# Patient Record
Sex: Male | Born: 1995 | Race: Black or African American | Hispanic: No | Marital: Single | Smoking: Never smoker
Health system: Southern US, Community
[De-identification: ages and names within clinical notes are randomized; demographics above are authoritative.]

## PROBLEM LIST (undated history)

## (undated) DIAGNOSIS — E7800 Pure hypercholesterolemia, unspecified: Secondary | ICD-10-CM

## (undated) DIAGNOSIS — E119 Type 2 diabetes mellitus without complications: Secondary | ICD-10-CM

---

## 2020-03-15 ENCOUNTER — Encounter (HOSPITAL_COMMUNITY): Payer: Self-pay | Admitting: Emergency Medicine

## 2020-03-15 ENCOUNTER — Ambulatory Visit (INDEPENDENT_AMBULATORY_CARE_PROVIDER_SITE_OTHER)
Admission: EM | Admit: 2020-03-15 | Discharge: 2020-03-15 | Disposition: A | Discharge status: Home / Self Care | Payer: BC Managed Care – PPO | Source: Home / Self Care

## 2020-03-15 ENCOUNTER — Other Ambulatory Visit: Payer: Self-pay

## 2020-03-15 ENCOUNTER — Emergency Department (HOSPITAL_COMMUNITY)
Admission: EM | Admit: 2020-03-15 | Discharge: 2020-03-15 | Disposition: A | Discharge status: Home / Self Care | Payer: BC Managed Care – PPO | Attending: Emergency Medicine | Admitting: Emergency Medicine

## 2020-03-15 DIAGNOSIS — L0231 Cutaneous abscess of buttock: Secondary | ICD-10-CM

## 2020-03-15 DIAGNOSIS — Z5321 Procedure and treatment not carried out due to patient leaving prior to being seen by health care provider: Secondary | ICD-10-CM | POA: Insufficient documentation

## 2020-03-15 HISTORY — DX: Pure hypercholesterolemia, unspecified: E78.00

## 2020-03-15 HISTORY — DX: Type 2 diabetes mellitus without complications: E11.9

## 2020-03-15 LAB — CBC
HCT: 41.3 % (ref 39.0–52.0)
Hemoglobin: 14 g/dL (ref 13.0–17.0)
MCH: 28.5 pg (ref 26.0–34.0)
MCHC: 33.9 g/dL (ref 30.0–36.0)
MCV: 83.9 fL (ref 80.0–100.0)
Platelets: 244 10*3/uL (ref 150–400)
RBC: 4.92 MIL/uL (ref 4.22–5.81)
RDW: 12.3 % (ref 11.5–15.5)
WBC: 10.7 10*3/uL — ABNORMAL HIGH (ref 4.0–10.5)
nRBC: 0 % (ref 0.0–0.2)

## 2020-03-15 LAB — URINALYSIS, ROUTINE W REFLEX MICROSCOPIC
Bacteria, UA: NONE SEEN
Bilirubin Urine: NEGATIVE
Glucose, UA: >=500 mg/dL — AB
Ketones, ur: NEGATIVE mg/dL
Leukocytes,Ua: NEGATIVE
Nitrite: NEGATIVE
Protein, ur: NEGATIVE mg/dL
Specific Gravity, Urine: 1.027 (ref 1.005–1.030)
pH: 6 (ref 5.0–8.0)

## 2020-03-15 LAB — BASIC METABOLIC PANEL
Anion gap: 11 (ref 5–15)
BUN: 10 mg/dL (ref 6–20)
CO2: 24 mmol/L (ref 22–32)
Calcium: 8.8 mg/dL — ABNORMAL LOW (ref 8.9–10.3)
Chloride: 95 mmol/L — ABNORMAL LOW (ref 98–111)
Creatinine, Ser: 1.07 mg/dL (ref 0.61–1.24)
GFR calc Af Amer: >60 mL/min (ref >60)
GFR calc non Af Amer: >60 mL/min (ref >60)
Glucose, Bld: 470 mg/dL — ABNORMAL HIGH (ref 70–99)
Potassium: 4.8 mmol/L (ref 3.5–5.1)
Sodium: 130 mmol/L — ABNORMAL LOW (ref 135–145)

## 2020-03-15 LAB — CBG MONITORING, ED: Glucose-Capillary: 463 mg/dL — ABNORMAL HIGH (ref 70–99)

## 2020-03-15 MED ORDER — AMOXICILLIN-POT CLAVULANATE 875-125 MG PO TABS: 1.0000 | ORAL_TABLET | Freq: Two times a day (BID) | ORAL | 0 refills | Status: DC

## 2020-03-15 MED ORDER — MUPIROCIN 2 % EX OINT: 1.0000 "application " | TOPICAL_OINTMENT | Freq: Two times a day (BID) | CUTANEOUS | 0 refills | Status: DC

## 2020-03-15 NOTE — ED Triage Notes (Signed)
Pt reports abscess to buttocks X3 days. Hx Type 2 DM, states his sugars have been running "high" over past few days.

## 2020-03-15 NOTE — ED Triage Notes (Signed)
Pt c/o abscess to buttocks x3-4 days. States has drained a small amount and having severe pain.

## 2020-03-15 NOTE — ED Provider Notes (Signed)
[facility]    CSN: [id number] Arrival date & time: 03/15/20  8588      History   Chief Complaint Chief Complaint  Patient presents with  . Abscess    HPI [name] is a 24 y.o. male.   24 year old male with insulin dependant DM comes in for 3-4 day history of possible abscess. Has had pain and swelling to the left buttock, for which he feels self drained yesterday without much relief. Size has been increasing despite self drainage. Given location, unsure of erythema, warmth. Denies fever, chills, body aches. Has not tried anything for the symptoms.  DM uncontrolled, last a1c ~14, overdue for follow up.     Past Medical History:  Diagnosis Date  . Diabetes mellitus without complication ([place])   . High cholesterol     There are no problems to display for this patient.   History reviewed. No pertinent surgical history.     Home Medications    Prior to Admission medications   Medication Sig Start Date End Date Taking? Authorizing Provider  atorvastatin (LIPITOR) 80 MG tablet Take 80 mg by mouth daily.   Yes [provider]  glipiZIDE (GLUCOTROL) 10 MG tablet Take 5 mg by mouth daily before breakfast.   Yes [provider]  insulin glargine (LANTUS) 100 UNIT/ML injection Inject 35 Units into the skin daily.   Yes [provider]  amoxicillin-clavulanate (AUGMENTIN) 875-125 MG tablet Take 1 tablet by mouth every 12 (twelve) hours. 03/15/20   Tasia Catchings, Sharne Linders V, PA-C  mupirocin ointment (BACTROBAN) 2 % Apply 1 application topically 2 (two) times daily. 03/15/20   Ok Edwards, PA-C    Family History History reviewed. No pertinent family history.  Social History Social History   Tobacco Use  . Smoking status: Never Smoker  . Smokeless tobacco: Never Used  Substance Use Topics  . Alcohol use: Yes    Comment: Socially   . Drug use: Not Currently     Allergies   Patient has no known allergies.   Review of Systems Review  of Systems  Reason unable to perform ROS: See HPI as above.     Physical Exam Triage Vital Signs ED Triage Vitals  Enc Vitals Group     BP 03/15/20 0828 117/71     Pulse Rate 03/15/20 0828 (!) 106     Resp 03/15/20 0828 16     Temp 03/15/20 0828 100.3 F (37.9 C)     Temp Source 03/15/20 0828 Oral     SpO2 03/15/20 0828 94 %     Weight --      Height --      Head Circumference --      Peak Flow --      Pain Score 03/15/20 0829 9     Pain Loc --      Pain Edu? --      Excl. in [place]? --    No data found.  Updated Vital Signs BP 117/71 (BP Location: Left Arm)   Pulse (!) 106   Temp 100.3 F (37.9 C) (Oral)   Resp 16   SpO2 94%   Visual Acuity Right Eye Distance:   Left Eye Distance:   Bilateral Distance:    Right Eye Near:   Left Eye Near:    Bilateral Near:     Physical Exam Exam conducted with a chaperone present.  Constitutional:      General: He is not in acute distress.  Appearance: Normal appearance. He is well-developed. He is not toxic-appearing or diaphoretic.  HENT:     Head: Normocephalic and atraumatic.  Eyes:     Conjunctiva/sclera: Conjunctivae normal.     Pupils: Pupils are equal, round, and reactive to light.  Pulmonary:     Effort: Pulmonary effort is normal. No respiratory distress.     Comments: Speaking in full sentences without difficulty Genitourinary:    Comments: Left buttock abscess adjacent to the intergluteal cleft. 5cm away from rectum and does not extend to the rectum. No tenderness to the rectum. No rectal wall pain.  Abscess measures 1cm x 2cm with surrounding induration. No erythema, warmth.  Musculoskeletal:     Cervical back: Normal range of motion and neck supple.  Skin:    General: Skin is warm and dry.  Neurological:     Mental Status: He is alert and oriented to person, place, and time.      UC Treatments / Results  Labs (all labs ordered are listed, but only abnormal results are displayed) Labs Reviewed    AEROBIC CULTURE (SUPERFICIAL SPECIMEN)    EKG   Radiology No results found.  Procedures Incision and Drainage  Date/Time: 03/15/2020 9:26 AM Performed by: Belinda Fisher, PA-C Authorized by: Belinda Fisher, PA-C   Consent:    Consent obtained:  Verbal   Consent given by:  Patient   Risks discussed:  Bleeding, incomplete drainage, pain, infection and damage to other organs   Alternatives discussed:  Alternative treatment and referral Location:    Type:  Abscess   Size:  1cm x 2cm   Location:  Lower extremity   Lower extremity location:  Buttock   Buttock location:  L buttock Pre-procedure details:    Skin preparation:  Chloraprep Anesthesia (see MAR for exact dosages):    Anesthesia method:  Local infiltration   Local anesthetic:  Lidocaine 2% WITH epi Procedure type:    Complexity:  Simple Procedure details:    Needle aspiration: no     Incision types:  Single straight   Incision depth:  Subcutaneous   Scalpel blade:  11   Wound management:  Probed and deloculated, irrigated with saline and extensive cleaning   Drainage:  Purulent and bloody   Drainage amount:  Moderate   Wound treatment:  Wound left open   Packing materials:  None Post-procedure details:    Patient tolerance of procedure:  Tolerated well, no immediate complications   (including critical care time)  Medications Ordered in UC Medications - No data to display  Initial Impression / Assessment and Plan / UC Course  I have reviewed the triage vital signs and the nursing notes.  Pertinent labs & imaging results that were available during my care of the patient were reviewed by me and considered in my medical decision making (see chart for details).    Patient tolerated procedure well. Given uncontrolled DM, wound culture obtained. Patient to start augmentin. Wound care instructions given. Return precautions given. Patient expresses understanding and agrees to plan.   Final Clinical Impressions(s) / UC  Diagnoses   Final diagnoses:  Left buttock abscess   ED Prescriptions    Medication Sig Dispense Auth. Provider   amoxicillin-clavulanate (AUGMENTIN) 875-125 MG tablet Take 1 tablet by mouth every 12 (twelve) hours. 14 tablet Floraine Buechler V, PA-C   mupirocin ointment (BACTROBAN) 2 % Apply 1 application topically 2 (two) times daily. 22 g Belinda Fisher, PA-C     PDMP not reviewed  this encounter.   Belinda Fisher, PA-C 03/15/20 [phone]

## 2020-03-15 NOTE — Discharge Instructions (Addendum)
Start augmentin as directed. You can remove current dressing in 24 hours. Keep wound clean and dry. You can clean gently with soap and water. Warm compresses. Monitor for spreading redness, increased warmth, increased swelling, fever, follow up for reevaluation needed.

## 2020-03-20 LAB — AEROBIC CULTURE W GRAM STAIN (SUPERFICIAL SPECIMEN)

## 2020-09-18 ENCOUNTER — Other Ambulatory Visit: Payer: BC Managed Care – PPO

## 2020-10-25 ENCOUNTER — Other Ambulatory Visit: Payer: Self-pay

## 2020-10-25 ENCOUNTER — Ambulatory Visit (HOSPITAL_COMMUNITY)
Admission: EM | Admit: 2020-10-25 | Discharge: 2020-10-25 | Disposition: A | Discharge status: Home / Self Care | Payer: Self-pay | Attending: Family Medicine | Admitting: Family Medicine

## 2020-10-25 ENCOUNTER — Encounter (HOSPITAL_COMMUNITY): Payer: Self-pay

## 2020-10-25 DIAGNOSIS — R0789 Other chest pain: Secondary | ICD-10-CM

## 2020-10-25 MED ORDER — NAPROXEN 500 MG PO TABS: 500.0000 mg | ORAL_TABLET | Freq: Two times a day (BID) | ORAL | 0 refills | Status: AC

## 2020-10-25 NOTE — ED Triage Notes (Signed)
Pt in with c/o sharp right side chest pain that started this morning while at work. States he went to sleep and woke up an pain was worse.  Has not had any medication for pain  Denies any n/v, radiation to jaw, left chest, arm, back pain, sob, or dizziness

## 2020-10-25 NOTE — ED Notes (Addendum)
EKG ordered and completed. Shown to H. Sharyon Cable, NP

## 2020-10-25 NOTE — ED Provider Notes (Signed)
[facility]    CSN: [id number] Arrival date & time: 10/25/20  1329      History   Chief Complaint Chief Complaint  Patient presents with   Chest Pain    HPI [name] is a 24 y.o. male history of DM type II, hyperlipidemia, presenting today for evaluation of right-sided chest pain.  Patient reports that he began to develop some discomfort in his right chest at the end of his shift last night.  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  Denies injury or trauma.  He went to sleep, woke up with increased pain on the right side.  Pain is constant, but worsens with certain movements of his chest, become sharp in nature with certain movements.  Denies shortness of breath.  Denies radiation to left side abdomen or to back.  Denies any nausea or vomiting.  Denies headache or vision changes.  Denies dizziness or lightheadedness.  Reports occasional cigar smoking, denies cigarettes or vaping.  Denies recreational drug use including cocaine.  Denies prior DVT/PE.  Denies leg pain or unilateral leg swelling.  Has recently traveled to [place] with a 9-hour car ride.  Denies family history of early death from heart related issues.  HPI  Past Medical History:  Diagnosis Date   Diabetes mellitus without complication (HCC)    High cholesterol     There are no problems to display for this patient.   History reviewed. No pertinent surgical history.     Home Medications    Prior to Admission medications   Medication Sig Start Date End Date Taking? Authorizing Provider  atorvastatin (LIPITOR) 80 MG tablet Take 80 mg by mouth daily.   Yes [provider]  glipiZIDE (GLUCOTROL) 10 MG tablet Take 5 mg by mouth daily before breakfast.   Yes [provider]  insulin glargine (LANTUS) 100 UNIT/ML injection Inject 35 Units into the skin daily.   Yes [provider]  mupirocin ointment (BACTROBAN) 2 % Apply 1 application topically 2  (two) times daily. 03/15/20   Cathie Hoops, Amy V, PA-C  naproxen (NAPROSYN) 500 MG tablet Take 1 tablet (500 mg total) by mouth 2 (two) times daily for 7 days. 10/25/20 11/01/20  Gregery Walberg C, PA-C  RYBELSUS 3 MG TABS Take 1 tablet by mouth at bedtime. 06/21/20   [provider]    Family History History reviewed. No pertinent family history.  Social History Social History   Tobacco Use   Smoking status: Never Smoker   Smokeless tobacco: Never Used  Substance Use Topics   Alcohol use: Yes    Comment: Socially    Drug use: Not Currently     Allergies   Patient has no known allergies.   Review of Systems Review of Systems  Constitutional: Negative for activity change, appetite change, chills, fatigue and fever.  HENT: Negative for congestion, ear pain, rhinorrhea, sinus pressure, sore throat and trouble swallowing.   Eyes: Negative for discharge and redness.  Respiratory: Negative for cough, chest tightness and shortness of breath.   Cardiovascular: Positive for chest pain.  Gastrointestinal: Negative for abdominal pain, diarrhea, nausea and vomiting.  Musculoskeletal: Negative for myalgias.  Skin: Negative for rash.  Neurological: Negative for dizziness, light-headedness and headaches.     Physical Exam Triage Vital Signs ED Triage Vitals  Enc Vitals Group     BP 10/25/20 1451 (!) 154/86     Pulse Rate 10/25/20 1451 92     Resp  10/25/20 1451 19     Temp --      Temp Source 10/25/20 1451 Oral     SpO2 10/25/20 1451 100 %     Weight --      Height --      Head Circumference --      Peak Flow --      Pain Score 10/25/20 1449 8     Pain Loc --      Pain Edu? --      Excl. in [place]? --    No data found.  Updated Vital Signs BP (!) 154/86 (BP Location: Right Arm)    Pulse 92    Resp 19    SpO2 100%   Visual Acuity Right Eye Distance:   Left Eye Distance:   Bilateral Distance:    Right Eye Near:   Left Eye Near:    Bilateral Near:     Physical  Exam Vitals and nursing note reviewed.  Constitutional:      Appearance: He is well-developed.     Comments: No acute distress  HENT:     Head: Normocephalic and atraumatic.     Nose: Nose normal.     Mouth/Throat:     Comments: Oral mucosa pink and moist, no tonsillar enlargement or exudate. Posterior pharynx patent and nonerythematous, no uvula deviation or swelling. Normal phonation. Eyes:     Extraocular Movements: Extraocular movements intact.     Conjunctiva/sclera: Conjunctivae normal.     Pupils: Pupils are equal, round, and reactive to light.  Cardiovascular:     Rate and Rhythm: Normal rate.  Pulmonary:     Effort: Pulmonary effort is normal. No respiratory distress.     Comments: Breathing comfortably at rest, CTABL, no wheezing, rales or other adventitious sounds auscultated  Reproducible tenderness to palpation of right anterior chest Abdominal:     General: There is no distension.  Musculoskeletal:        General: Normal range of motion.     Cervical back: Neck supple.     Comments: Trace edema from socks, no unilateral swelling, no calf tenderness, negative Homans bilaterally  Skin:    General: Skin is warm and dry.  Neurological:     Mental Status: He is alert and oriented to person, place, and time.      UC Treatments / Results  Labs (all labs ordered are listed, but only abnormal results are displayed) Labs Reviewed - No data to display  EKG   Radiology No results found.  Procedures Procedures (including critical care time)  Medications Ordered in UC Medications - No data to display  Initial Impression / Assessment and Plan / UC Course  I have reviewed the triage vital signs and the nursing notes.  Pertinent labs & imaging results that were available during my care of the patient were reviewed by me and considered in my medical decision making (see chart for details).  Clinical Course as of Oct 26 1527  Thu Oct 25, 2020  1502 EKG normal  sinus rhythm, nonspecific T wave inversions noted in lead III, no acute signs of ischemia or infarction   [HW]    Clinical Course User Index [HW] Nello Corro C, PA-C    EKG reassuring, reproducible tenderness to palpation, worse with certain movements of chest, suspect most likely chest wall pain/inflammation and recommending anti-inflammatories.  Advised to continue to monitor, if moving towards left, worsening or pain exertional to follow-up in emergency room.  Follow-up with PCP.  Discussed strict return precautions. Patient verbalized understanding and is agreeable with plan.  Final Clinical Impressions(s) / UC Diagnoses   Final diagnoses:  Chest wall pain  Atypical chest pain     Discharge Instructions     EKG normal Naprosyn twice daily with food Monitor for gradual improvement of pain Follow up in the emergency room if pain worsening, developing left sided pain, shortness of breath    ED Prescriptions    Medication Sig Dispense Auth. Provider   naproxen (NAPROSYN) 500 MG tablet Take 1 tablet (500 mg total) by mouth 2 (two) times daily for 7 days. 30 tablet Vonceil [place], [place] C, PA-C     PDMP not reviewed this encounter.   Lew Dawes, PA-C 10/25/20 1528

## 2020-10-25 NOTE — Discharge Instructions (Addendum)
EKG normal Naprosyn twice daily with food Monitor for gradual improvement of pain Follow up in the emergency room if pain worsening, developing left sided pain, shortness of breath

## 2021-01-02 ENCOUNTER — Encounter (HOSPITAL_COMMUNITY): Payer: Self-pay

## 2021-01-02 ENCOUNTER — Other Ambulatory Visit: Payer: Self-pay

## 2021-01-02 ENCOUNTER — Ambulatory Visit (HOSPITAL_COMMUNITY)
Admission: EM | Admit: 2021-01-02 | Discharge: 2021-01-02 | Disposition: A | Discharge status: Home / Self Care | Payer: 59 | Attending: Urgent Care | Admitting: Urgent Care

## 2021-01-02 DIAGNOSIS — R1010 Upper abdominal pain, unspecified: Secondary | ICD-10-CM | POA: Diagnosis not present

## 2021-01-02 DIAGNOSIS — E1165 Type 2 diabetes mellitus with hyperglycemia: Secondary | ICD-10-CM

## 2021-01-02 LAB — CBG MONITORING, ED: Glucose-Capillary: 339 mg/dL — ABNORMAL HIGH (ref 70–99)

## 2021-01-02 MED ORDER — FAMOTIDINE 20 MG PO TABS: 20.0000 mg | ORAL_TABLET | Freq: Two times a day (BID) | ORAL | 0 refills | Status: AC

## 2021-01-02 NOTE — ED Triage Notes (Signed)
Pt presents with stomach pain x 1 day. Pt states he is unsure if it is caused by drinking a lot of soda and caffeine in  The past 24 hours. Pt states he is having a headache x 1 day. He states he did not take OTC to relieve pain. Pt denies nausea and vomiting.

## 2021-01-02 NOTE — ED Provider Notes (Signed)
[facility]   MRN: [id number] DOB: 1996/07/11  Subjective:   [name] is a 25 y.o. male presenting for 1 day history of acute onset upper abdominal pain.  Patient is an uncontrolled diabetic.  He recently started taking his insulin again.  Admits that he is not practicing any kind of healthy diet.  Denies fever, headache, confusion, chest pain, shortness of breath, vomiting, diarrhea, bloody stools.  He is drinking a lot of soda and caffeine beverages.  No current facility-administered medications for this encounter.  Current Outpatient Medications:  .  glipiZIDE (GLUCOTROL) 10 MG tablet, Take 5 mg by mouth daily before breakfast., Disp: , Rfl:  .  atorvastatin (LIPITOR) 80 MG tablet, Take 80 mg by mouth daily., Disp: , Rfl:  .  insulin glargine (LANTUS) 100 UNIT/ML injection, Inject 35 Units into the skin daily., Disp: , Rfl:  .  mupirocin ointment (BACTROBAN) 2 %, Apply 1 application topically 2 (two) times daily., Disp: 22 g, Rfl: 0 .  RYBELSUS 3 MG TABS, Take 1 tablet by mouth at bedtime., Disp: , Rfl:    No Known Allergies  Past Medical History:  Diagnosis Date  . Diabetes mellitus without complication (H[facility])   . High cholesterol      History reviewed. No pertinent surgical history.  History reviewed. No pertinent family history.  Social History   Tobacco Use  . Smoking status: Never Smoker  . Smokeless tobacco: Never Used  Substance Use Topics  . Alcohol use: Yes    Comment: Socially   . Drug use: Not Currently    ROS   Objective:   Vitals: BP 135/86 (BP Location: Left Arm)   Pulse 98   Temp 98.9 F (37.2 C) (Oral)   Resp 17   SpO2 98%   Physical Exam Constitutional:      General: He is not in acute distress.    Appearance: Normal appearance. He is well-developed and well-nourished. He is not ill-appearing, toxic-appearing or diaphoretic.  HENT:     Head: Normocephalic and atraumatic.     Right Ear: External ear normal.      Left Ear: External ear normal.     Nose: Nose normal.     Mouth/Throat:     Mouth: Oropharynx is clear and moist. Mucous membranes are moist.     Pharynx: Oropharynx is clear.  Eyes:     General: No scleral icterus.    Extraocular Movements: Extraocular movements intact.     Pupils: Pupils are equal, round, and reactive to light.  Cardiovascular:     Rate and Rhythm: Normal rate and regular rhythm.     Pulses: Intact distal pulses.     Heart sounds: Normal heart sounds. No murmur heard. No friction rub. No gallop.   Pulmonary:     Effort: Pulmonary effort is normal. No respiratory distress.     Breath sounds: Normal breath sounds. No stridor. No wheezing, rhonchi or rales.  Abdominal:     General: Bowel sounds are normal. There is no distension.     Palpations: Abdomen is soft. There is no mass.     Tenderness: There is no abdominal tenderness. There is no guarding or rebound.  Skin:    General: Skin is warm and dry.  Neurological:     Mental Status: He is alert and oriented to person, place, and time.  Psychiatric:        Mood and Affect: Mood and affect and mood normal.  Behavior: Behavior normal.        Thought Content: Thought content normal.     Results for orders placed or performed during the hospital encounter of 01/02/21 (from the past 24 hour(s))  POC CBG monitoring     Status: Abnormal   Collection Time: 01/02/21  4:00 PM  Result Value Ref Range   Glucose-Capillary 339 (H) 70 - 99 mg/dL    Assessment and Plan :   PDMP not reviewed this encounter.  1. Uncontrolled type 2 diabetes mellitus with hyperglycemia (HCC)   2. Upper abdominal pain     Offered Pepcid to address possible acid reflux but have high suspicion that patient's poor diet choices are affecting his upper abdominal pain.  He had none on exam.  Recommended strict diabetic diet.  Follow-up with primary care provider for better diabetic control. Counseled patient on potential for adverse  effects with medications prescribed/recommended today, ER and return-to-clinic precautions discussed, patient verbalized understanding.    Wallis Bamberg, PA-C 01/02/21 1635

## 2021-01-02 NOTE — Discharge Instructions (Addendum)

## 2021-04-04 NOTE — Progress Notes (Addendum)
[facility] - Clinic Note  04/08/2021     CHIEF COMPLAINT Patient presents for Retina Evaluation   HISTORY OF PRESENT ILLNESS: [name] is a 25 y.o. male who presents to the clinic today for:   HPI    Retina Evaluation    In both eyes.  This started 4 months ago.  Duration of 7 days.  Associated Symptoms Distortion.  Treatments tried include no treatments.  I, the attending physician,  performed the HPI with the patient and updated documentation appropriately.          Comments    Pt referred by Dr. Illene Manning for CME OU, OS worse than OD. Pt states that in January 2022 is when he first noticed distortion in vision OS. States that he will look at straight lines and see jagged marks. No reports of floaters or flashes. Does report some soreness when he closes his eyes, mostly just OS.        Last edited by Greg Chris, MD on 04/08/2021  4:17 PM. (History)    pt is here on the referral of Dr. Illene Manning for concern of CME OS, pt states he went to see her for a routine eye exam, but is having a problem with his left eye, he states to the right of his center vision, he sees a yellow spot that distorts his vision, he started to notices this 2-3 months ago and has stayed the same since then, pt is diabetic (dx 2009) and his last A1c was 12.1 in September 2021, pt states he has been under a lot of stress since February  Referring physician: Illene Manning, OD [address] [place],  [place] [address]  HISTORICAL INFORMATION:   Selected notes from the MEDICAL RECORD NUMBER Referred by Dr. Illene Manning for eval of CME OU LEE:  Ocular Hx- PMH-    CURRENT MEDICATIONS: No current outpatient medications on file. (Ophthalmic Drugs)   No current facility-administered medications for this visit. (Ophthalmic Drugs)   Current Outpatient Medications (Other)  Medication Sig  . atorvastatin (LIPITOR) 80 MG tablet Take 80 mg by mouth daily.  .  famotidine (PEPCID) 20 MG tablet Take 1 tablet (20 mg total) by mouth 2 (two) times daily.  Marland Kitchen glipiZIDE (GLUCOTROL) 10 MG tablet Take 5 mg by mouth daily before breakfast.  . insulin glargine (LANTUS) 100 UNIT/ML injection Inject 35 Units into the skin daily.  . mupirocin ointment (BACTROBAN) 2 % Apply 1 application topically 2 (two) times daily.  . RYBELSUS 3 MG TABS Take 1 tablet by mouth at bedtime.   No current facility-administered medications for this visit. (Other)      REVIEW OF SYSTEMS: ROS    Positive for: Endocrine   Negative for: Constitutional, Gastrointestinal, Neurological, Skin, Genitourinary, Musculoskeletal, HENT, Cardiovascular, Eyes, Respiratory, Psychiatric, Allergic/Imm, Heme/Lymph   Last edited by Celine Mans, COA on 04/08/2021  1:16 PM. (History)       ALLERGIES No Known Allergies  PAST MEDICAL HISTORY Past Medical History:  Diagnosis Date  . Diabetes mellitus without complication (HCC)   . High cholesterol    History reviewed. No pertinent surgical history.  FAMILY HISTORY Family History  Problem Relation Age of Onset  . Diabetes Mother   . Hypertension Mother     SOCIAL HISTORY Social History   Tobacco Use  . Smoking status: Never Smoker  . Smokeless tobacco: Never Used  Substance Use Topics  . Alcohol use: Yes  Comment: Socially   . Drug use: Not Currently         OPHTHALMIC EXAM:  Base Eye Exam    Visual Acuity (Snellen - Linear)      Right Left   Dist [place] 20/40 -2 20/30 -1   Dist ph [place] 20/30 +1 20/25       Tonometry (Tonopen, 1:16 PM)      Right Left   Pressure 23 24       Pupils      Dark Light Shape React APD   Right 3 2 Round Brisk None   Left 3 2 Round Brisk None       Visual Fields (Counting fingers)      Left Right    Full Full       Extraocular Movement      Right Left    Full, Ortho Full, Ortho       Neuro/Psych    Oriented x3: Yes   Mood/Affect: Normal       Dilation    Both eyes: 1.0%  Mydriacyl, 2.5% Phenylephrine @ 1:17 PM        Slit Lamp and Fundus Exam    Slit Lamp Exam      Right Left   Lids/Lashes Normal mild MGD   Conjunctiva/Sclera White and quiet White and quiet   Cornea Clear Clear   Anterior Chamber Deep and quiet Deep and quiet   Iris Round and dilated, No NVI Round and dilated, No NVI   Lens Clear Clear   Vitreous Mild Vitreous syneresis Mild Vitreous syneresis       Fundus Exam      Right Left   Disc Pink and Sharp Pink and Sharp   C/D Ratio 0.5 0.5   Macula Flat, Good foveal reflex, rare MA, No edema Blunted foveal reflex, focal IRH and exudates temporal fovea, rare MA   Vessels mild tortuousity mild tortuousity   Periphery Attached, rare MA   Attached, rare MA           Refraction    Wearing Rx   Pt did receive rx from Dr. Katrinka Blazing on 04/02/21 but is waiting on specs. Does not currently wear them.        Manifest Refraction      Sphere Cylinder Dist VA   Right -0.50 Sphere 20/25-2   Left -0.50 Sphere 20/30+2          IMAGING AND PROCEDURES  Imaging and Procedures for 04/08/2021  OCT, Retina - OU - Both Eyes       Right Eye Quality was good. Central Foveal Thickness: 247. Progression has no prior data. Findings include normal foveal contour, no IRF, no SRF.   Left Eye Quality was good. Central Foveal Thickness: 293. Progression has no prior data. Findings include abnormal foveal contour, intraretinal fluid, intraretinal hyper-reflective material, subretinal fluid (Central IRF/SRF/IRHM).   Notes *Images captured and stored on drive  Diagnosis / Impression:  OD: NFP, no IRF/SRF OS: Central IRF/SRF/IRHM  Clinical management:  See below  Abbreviations: NFP - Normal foveal profile. CME - cystoid macular edema. PED - pigment epithelial detachment. IRF - intraretinal fluid. SRF - subretinal fluid. EZ - ellipsoid zone. ERM - epiretinal membrane. ORA - outer retinal atrophy. ORT - outer retinal tubulation. SRHM - subretinal  hyper-reflective material. IRHM - intraretinal hyper-reflective material               ASSESSMENT/PLAN:    ICD-10-CM   1. Retinal edema  H35.81 OCT, Retina - OU - Both Eyes  2. 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 Retinal Edema OS  - pt reports 2 mo history of nasal paracentral distortion -- started in Feb and has been relatively stable  - exam shows focal intraretinal heme w/ surrounding exudates, temporal fovea  - OCT shows focal cluster of edema w/ IRF, SRF, IRHM, temporal fovea  - differential includes DME, CSR and hypertensive retinopathy / retinal macroaneurysm  - BP in office today, 130s/80s  - pt endorses being under a lot of stress since February (facing eviction and repossession)  - needs fluorescein angiogram to discern -- unable to perform today -- will check in 3 wks  - f/u 3 weeks, DFE, OCT, FA (transit OS)  2. Mild-moderate nonproliferative diabetic retinopathy OU  - OD w/o DME  - OS w/ DME  - A1c 12.1% in September 2021 - The incidence, risk factors for progression, natural history and treatment options for diabetic retinopathy were discussed with patient.   - The need for close monitoring of blood glucose, blood pressure, and serum lipids, avoiding cigarette or any type of tobacco, and the need for long term follow up was also discussed with patient. - exam shows rare MA - OCT without diabetic macular edema OD, OS w/ focal edema, temporal fovea as above  - f/u 3 weeks, DFE, OCT, FA (transit OS)   Ophthalmic Meds Ordered this visit:  No orders of the defined types were placed in this encounter.      Return in about 3 weeks (around 04/29/2021) for f/u retinal edema OS, DFE, OCT, FA (transit OS).  There are no Patient Instructions on file for this visit.  This document serves as a record of services personally performed by Karie ChimeraBrian G. Vitor Overbaugh, MD, PhD. It  was created on their behalf by Herby AbrahamAshley English, COA, an ophthalmic technician. The creation of this record is the provider's dictation and/or activities during the visit.    Electronically signed by: Herby AbrahamAshley English, COA @TODAY @ 4:25 PM  Karie ChimeraBrian G. Benigna Delisi, M.D., Ph.D. Diseases & Surgery of the Retina and Vitreous [facility] 04/08/2021   I have reviewed the above documentation for accuracy and completeness, and I agree with the above. Karie ChimeraBrian G. Falicity Sheets, M.D., Ph.D. 04/08/21 4:25 PM   Abbreviations: M myopia (nearsighted); A astigmatism; H hyperopia (farsighted); P presbyopia; Mrx spectacle prescription;  CTL contact lenses; OD right eye; OS left eye; OU both eyes  XT exotropia; ET esotropia; PEK punctate epithelial keratitis; PEE punctate epithelial erosions; DES dry eye syndrome; MGD meibomian gland dysfunction; ATs artificial tears; PFAT's preservative free artificial tears; NSC nuclear sclerotic cataract; PSC posterior subcapsular cataract; ERM epi-retinal membrane; PVD posterior vitreous detachment; RD retinal detachment; DM diabetes mellitus; DR diabetic retinopathy; NPDR non-proliferative diabetic retinopathy; PDR proliferative diabetic retinopathy; CSME clinically significant macular edema; DME diabetic macular edema; dbh dot blot hemorrhages; CWS cotton wool spot; POAG primary open angle glaucoma; C/D cup-to-disc ratio; HVF humphrey visual field; GVF goldmann visual field; OCT optical coherence tomography; IOP intraocular pressure; BRVO Branch retinal vein occlusion; CRVO central retinal vein occlusion; CRAO central retinal artery occlusion; BRAO branch retinal artery occlusion; RT retinal tear; SB scleral buckle; PPV pars plana vitrectomy; VH Vitreous hemorrhage; PRP panretinal laser photocoagulation; IVK intravitreal kenalog; VMT vitreomacular traction; MH Macular hole;  NVD neovascularization of the disc; NVE neovascularization elsewhere; AREDS age related eye  disease study;  ARMD age related macular degeneration; POAG primary open angle glaucoma; EBMD epithelial/anterior basement membrane dystrophy; ACIOL anterior chamber intraocular lens; IOL intraocular lens; PCIOL posterior chamber intraocular lens; Phaco/IOL phacoemulsification with intraocular lens placement; PRK photorefractive keratectomy; LASIK laser assisted in situ keratomileusis; HTN hypertension; DM diabetes mellitus; COPD chronic obstructive pulmonary disease

## 2021-04-08 ENCOUNTER — Ambulatory Visit (INDEPENDENT_AMBULATORY_CARE_PROVIDER_SITE_OTHER): Payer: 59 | Admitting: Ophthalmology

## 2021-04-08 ENCOUNTER — Other Ambulatory Visit: Payer: Self-pay

## 2021-04-08 ENCOUNTER — Encounter (INDEPENDENT_AMBULATORY_CARE_PROVIDER_SITE_OTHER): Payer: Self-pay | Admitting: Ophthalmology

## 2021-04-08 VITALS — BP 130/88

## 2021-04-08 DIAGNOSIS — H3581 Retinal edema: Secondary | ICD-10-CM | POA: Diagnosis not present

## 2021-04-08 DIAGNOSIS — E113311 Type 2 diabetes mellitus with moderate nonproliferative diabetic retinopathy with macular edema, right eye: Secondary | ICD-10-CM | POA: Diagnosis not present

## 2021-04-26 NOTE — Progress Notes (Shared)
[facility] - Clinic Note  04/29/2021     CHIEF COMPLAINT Patient presents for No chief complaint on file.   HISTORY OF PRESENT ILLNESS: [name] is a 25 y.o. male who presents to the clinic today for:   pt is here on the referral of Dr. Illene Labrador for concern of CME OS, pt states he went to see her for a routine eye exam, but is having a problem with his left eye, he states to the right of his center vision, he sees a yellow spot that distorts his vision, he started to notices this 2-3 months ago and has stayed the same since then, pt is diabetic (dx 2009) and his last A1c was 12.1 in September 2021, pt states he has been under a lot of stress since February  Referring physician: Caralee Ates, MD [address] [place],  [place] [address]  HISTORICAL INFORMATION:   Selected notes from the MEDICAL RECORD NUMBER Referred by Dr. Illene Labrador for eval of CME OU LEE:  Ocular Hx- PMH-    CURRENT MEDICATIONS: No current outpatient medications on file. (Ophthalmic Drugs)   No current facility-administered medications for this visit. (Ophthalmic Drugs)   Current Outpatient Medications (Other)  Medication Sig  . atorvastatin (LIPITOR) 80 MG tablet Take 80 mg by mouth daily.  . famotidine (PEPCID) 20 MG tablet Take 1 tablet (20 mg total) by mouth 2 (two) times daily.  Marland Kitchen glipiZIDE (GLUCOTROL) 10 MG tablet Take 5 mg by mouth daily before breakfast.  . insulin glargine (LANTUS) 100 UNIT/ML injection Inject 35 Units into the skin daily.  . mupirocin ointment (BACTROBAN) 2 % Apply 1 application topically 2 (two) times daily.  . RYBELSUS 3 MG TABS Take 1 tablet by mouth at bedtime.   No current facility-administered medications for this visit. (Other)      REVIEW OF SYSTEMS:    ALLERGIES No Known Allergies  PAST MEDICAL HISTORY Past Medical History:  Diagnosis Date  . Diabetes mellitus without complication (HCC)   . High  cholesterol    No past surgical history on file.  FAMILY HISTORY Family History  Problem Relation Age of Onset  . Diabetes Mother   . Hypertension Mother     SOCIAL HISTORY Social History   Tobacco Use  . Smoking status: Never Smoker  . Smokeless tobacco: Never Used  Substance Use Topics  . Alcohol use: Yes    Comment: Socially   . Drug use: Not Currently         OPHTHALMIC EXAM:  Not recorded     IMAGING AND PROCEDURES  Imaging and Procedures for 04/29/2021         ASSESSMENT/PLAN:  No diagnosis found.  1. Retinal Edema OS  - pt reports 2 mo history of nasal paracentral distortion -- started in Feb and has been relatively stable  - exam shows focal intraretinal heme w/ surrounding exudates, temporal fovea  - OCT shows focal cluster of edema w/ IRF, SRF, IRHM, temporal fovea  - differential includes DME, CSR and hypertensive retinopathy / retinal macroaneurysm  - BP in office today, 130s/80s  - pt endorses being under a lot of stress since February (facing eviction and repossession)  - needs fluorescein angiogram to discern -- unable to perform today -- will check in 3 wks  - f/u 3 weeks, DFE, OCT, FA (transit OS)  2. Mild-moderate nonproliferative diabetic retinopathy OU  - OD w/o DME  - OS w/ DME  -  A1c 12.1% in September 2021 - The incidence, risk factors for progression, natural history and treatment options for diabetic retinopathy were discussed with patient.   - The need for close monitoring of blood glucose, blood pressure, and serum lipids, avoiding cigarette or any type of tobacco, and the need for long term follow up was also discussed with patient. - exam shows rare MA - OCT without diabetic macular edema OD, OS w/ focal edema, temporal fovea as above  - f/u 3 weeks DFE, OCT, FA (transit OS)   Ophthalmic Meds Ordered this visit:  No orders of the defined types were placed in this encounter.      No follow-ups on file.  There are no  Patient Instructions on file for this visit.  This document serves as a record of services personally performed by Karie Chimera, MD, PhD. It was created on their behalf by Annalee Genta, COMT. The creation of this record is the provider's dictation and/or activities during the visit.  Electronically signed by: Annalee Genta, COMT 04/26/21 7:24 AM    Karie Chimera, M.D., Ph.D. Diseases & Surgery of the Retina and Vitreous [facility] 04/08/2021     Abbreviations: M myopia (nearsighted); A astigmatism; H hyperopia (farsighted); P presbyopia; Mrx spectacle prescription;  CTL contact lenses; OD right eye; OS left eye; OU both eyes  XT exotropia; ET esotropia; PEK punctate epithelial keratitis; PEE punctate epithelial erosions; DES dry eye syndrome; MGD meibomian gland dysfunction; ATs artificial tears; PFAT's preservative free artificial tears; NSC nuclear sclerotic cataract; PSC posterior subcapsular cataract; ERM epi-retinal membrane; PVD posterior vitreous detachment; RD retinal detachment; DM diabetes mellitus; DR diabetic retinopathy; NPDR non-proliferative diabetic retinopathy; PDR proliferative diabetic retinopathy; CSME clinically significant macular edema; DME diabetic macular edema; dbh dot blot hemorrhages; CWS cotton wool spot; POAG primary open angle glaucoma; C/D cup-to-disc ratio; HVF humphrey visual field; GVF goldmann visual field; OCT optical coherence tomography; IOP intraocular pressure; BRVO Branch retinal vein occlusion; CRVO central retinal vein occlusion; CRAO central retinal artery occlusion; BRAO branch retinal artery occlusion; RT retinal tear; SB scleral buckle; PPV pars plana vitrectomy; VH Vitreous hemorrhage; PRP panretinal laser photocoagulation; IVK intravitreal kenalog; VMT vitreomacular traction; MH Macular hole;  NVD neovascularization of the disc; NVE neovascularization elsewhere; AREDS age related eye disease study; ARMD age related macular  degeneration; POAG primary open angle glaucoma; EBMD epithelial/anterior basement membrane dystrophy; ACIOL anterior chamber intraocular lens; IOL intraocular lens; PCIOL posterior chamber intraocular lens; Phaco/IOL phacoemulsification with intraocular lens placement; PRK photorefractive keratectomy; LASIK laser assisted in situ keratomileusis; HTN hypertension; DM diabetes mellitus; COPD chronic obstructive pulmonary disease

## 2021-04-29 ENCOUNTER — Encounter (INDEPENDENT_AMBULATORY_CARE_PROVIDER_SITE_OTHER): Payer: 59 | Admitting: Ophthalmology

## 2021-04-29 ENCOUNTER — Encounter (INDEPENDENT_AMBULATORY_CARE_PROVIDER_SITE_OTHER): Payer: Self-pay

## 2021-04-29 DIAGNOSIS — E113311 Type 2 diabetes mellitus with moderate nonproliferative diabetic retinopathy with macular edema, right eye: Secondary | ICD-10-CM

## 2021-04-29 DIAGNOSIS — H3581 Retinal edema: Secondary | ICD-10-CM

## 2021-06-23 ENCOUNTER — Encounter (HOSPITAL_COMMUNITY): Payer: Self-pay

## 2021-06-23 ENCOUNTER — Emergency Department (HOSPITAL_COMMUNITY): Payer: 59

## 2021-06-23 ENCOUNTER — Emergency Department (HOSPITAL_COMMUNITY)
Admission: EM | Admit: 2021-06-23 | Discharge: 2021-06-23 | Disposition: A | Discharge status: Home / Self Care | Payer: 59 | Attending: Emergency Medicine | Admitting: Emergency Medicine

## 2021-06-23 ENCOUNTER — Other Ambulatory Visit: Payer: Self-pay

## 2021-06-23 DIAGNOSIS — R079 Chest pain, unspecified: Secondary | ICD-10-CM | POA: Diagnosis present

## 2021-06-23 DIAGNOSIS — E1165 Type 2 diabetes mellitus with hyperglycemia: Secondary | ICD-10-CM | POA: Insufficient documentation

## 2021-06-23 DIAGNOSIS — Z7984 Long term (current) use of oral hypoglycemic drugs: Secondary | ICD-10-CM | POA: Diagnosis not present

## 2021-06-23 DIAGNOSIS — R0789 Other chest pain: Secondary | ICD-10-CM | POA: Diagnosis not present

## 2021-06-23 DIAGNOSIS — R739 Hyperglycemia, unspecified: Secondary | ICD-10-CM

## 2021-06-23 DIAGNOSIS — Z794 Long term (current) use of insulin: Secondary | ICD-10-CM | POA: Insufficient documentation

## 2021-06-23 LAB — CBC WITH DIFFERENTIAL/PLATELET
Abs Immature Granulocytes: 0.03 10*3/uL (ref 0.00–0.07)
Basophils Absolute: 0 10*3/uL (ref 0.0–0.1)
Basophils Relative: 0 %
Eosinophils Absolute: 0 10*3/uL (ref 0.0–0.5)
Eosinophils Relative: 0 %
HCT: 44.4 % (ref 39.0–52.0)
Hemoglobin: 15.7 g/dL (ref 13.0–17.0)
Immature Granulocytes: 0 %
Lymphocytes Relative: 23 %
Lymphs Abs: 2 10*3/uL (ref 0.7–4.0)
MCH: 29 pg (ref 26.0–34.0)
MCHC: 35.4 g/dL (ref 30.0–36.0)
MCV: 82.1 fL (ref 80.0–100.0)
Monocytes Absolute: 0.4 10*3/uL (ref 0.1–1.0)
Monocytes Relative: 4 %
Neutro Abs: 6.3 10*3/uL (ref 1.7–7.7)
Neutrophils Relative %: 73 %
Platelets: 271 10*3/uL (ref 150–400)
RBC: 5.41 MIL/uL (ref 4.22–5.81)
RDW: 11.9 % (ref 11.5–15.5)
WBC: 8.7 10*3/uL (ref 4.0–10.5)
nRBC: 0 % (ref 0.0–0.2)

## 2021-06-23 LAB — BASIC METABOLIC PANEL
Anion gap: 11 (ref 5–15)
BUN: 16 mg/dL (ref 6–20)
CO2: 23 mmol/L (ref 22–32)
Calcium: 9.4 mg/dL (ref 8.9–10.3)
Chloride: 97 mmol/L — ABNORMAL LOW (ref 98–111)
Creatinine, Ser: 1.14 mg/dL (ref 0.61–1.24)
GFR, Estimated: >60 mL/min (ref >60)
Glucose, Bld: 362 mg/dL — ABNORMAL HIGH (ref 70–99)
Potassium: 4.4 mmol/L (ref 3.5–5.1)
Sodium: 131 mmol/L — ABNORMAL LOW (ref 135–145)

## 2021-06-23 LAB — CBG MONITORING, ED
Glucose-Capillary: 485 mg/dL — ABNORMAL HIGH (ref 70–99)
Glucose-Capillary: 339 mg/dL — ABNORMAL HIGH (ref 70–99)

## 2021-06-23 LAB — TROPONIN I (HIGH SENSITIVITY)
Troponin I (High Sensitivity): 14 ng/L (ref <18)
Troponin I (High Sensitivity): 14 ng/L (ref <18)

## 2021-06-23 MED ORDER — GLIPIZIDE 10 MG PO TABS: 5.0000 mg | ORAL_TABLET | Freq: Every day | ORAL | 0 refills | Status: AC

## 2021-06-23 MED ORDER — PANTOPRAZOLE SODIUM 20 MG PO TBEC: 20.0000 mg | DELAYED_RELEASE_TABLET | Freq: Every day | ORAL | 0 refills | Status: AC

## 2021-06-23 MED ORDER — SODIUM CHLORIDE 0.9 % IV BOLUS
1000.0000 mL | Freq: Once | INTRAVENOUS | Status: AC
  Administered 2021-06-23: 1000 mL via INTRAVENOUS

## 2021-06-23 MED ORDER — INSULIN GLARGINE 100 UNIT/ML ~~LOC~~ SOLN: 35.0000 [IU] | Freq: Every day | SUBCUTANEOUS | 0 refills | Status: AC

## 2021-06-23 MED ORDER — INSULIN ASPART 100 UNIT/ML IJ SOLN
5.0000 [IU] | Freq: Once | INTRAMUSCULAR | Status: AC
  Administered 2021-06-23: 5 [IU] via SUBCUTANEOUS

## 2021-06-23 NOTE — ED Provider Notes (Signed)
Emergency Medicine Provider Triage Evaluation Note  [name] , a 25 y.o. male  was evaluated in triage.  Pt complains of chest pain.  This morning at approximately 0 815 after getting off work patient had an episode of left-sided chest pain that lasted for 7 minutes.  Patient describes pain as a pressure.  No radiation of pain.  Patient reports that he had associated shortness of breath.  No nausea, vomiting, diaphoresis, palpitations, or leg swelling.  Patient denies any chest pain or discomfort at this time.  Review of Systems  Positive: Chest pain, shortness of breath Negative: Nausea, vomiting, diaphoresis, palpitations, leg swelling  Physical Exam  BP (!) 137/93 (BP Location: Right Arm)   Pulse (!) 103   Temp 99.3 F (37.4 C) (Oral)   Resp 16   SpO2 100%  Gen:   Awake, no distress   Resp:  Normal effort, lungs clear to auscultation bilaterally MSK:   Moves extremities without difficulty  Other:  Abdomen soft, nondistended, nontender, no mass or pulsatile mass.  S1-S2 present with no murmurs rubs or gallops.  +2 carotid and radial pulses bilaterally.  No edema, swelling, or tenderness to bilateral lower extremities  Medical Decision Making  Medically screening exam initiated at 9:31 AM.  Appropriate orders placed.  Rhoderick Moody was informed that the remainder of the evaluation will be completed by another provider, this initial triage assessment does not replace that evaluation, and the importance of remaining in the ED until their evaluation is complete.  The patient appears stable so that the remainder of the work up may be completed by another provider.      Haskel Schroeder, PA-C 06/23/21 0935    Derwood Kaplan, MD 06/25/21 [phone]

## 2021-06-23 NOTE — ED Triage Notes (Signed)
Patient complains of chest pain this am that lasted for 7 minutes, no associated symptoms. On arrival pain resolved

## 2021-06-23 NOTE — Discharge Instructions (Signed)
Your chest pain is not likely due to heart disease however it would be prudent to follow-up closely with your doctor for further evaluation.  Heartburn can also cause your pain therefore please take Prilosec as prescribed.  Your blood sugar is markedly elevated today.  It is very important for you to take diabetic medication for better control of your blood sugar.  Follow-up closely with your doctor for further management.  Return if you have any concern.

## 2021-06-23 NOTE — ED Provider Notes (Signed)
[facility] EMERGENCY DEPARTMENT Provider Note   CSN: [id number] Arrival date & time: 06/23/21  2263     History No chief complaint on file.   [name] is a 25 y.o. male.  The history is provided by the patient and medical records. No language interpreter was used.   25 year old male significant history of diabetes, cholesterolemia who presents for evaluation of chest pain patient report for the past 4 to 5 days he has had noticed recurrent chest pain.  Patient works third shift and his pain usually is manifest at the end of his shift when he comes home.  He described pain as a gas-like sensation to his mid chest.  This pain started early this morning once he got off work.  It lasted for approximately 5 to 7 minutes.  Pain did not radiate and there was not any significant fever chills runny nose sneezing coughing sore throat loss of taste or smell back pain.  Denies denies any nausea vomiting diarrhea heart palpitation.  He smokes cigar on occasion, he is a social drinker, no prior history of PE or DVT.  He does have history of diabetes but states he normally does not have the funds to afford his insulin and does take it sporadically.  He does check his blood sugar at home on occasion and is usually greater than 200.  Past Medical History:  Diagnosis Date   Diabetes mellitus without complication (HCC)    High cholesterol     There are no problems to display for this patient.   History reviewed. No pertinent surgical history.     Family History  Problem Relation Age of Onset   Diabetes Mother    Hypertension Mother     Social History   Tobacco Use   Smoking status: Never   Smokeless tobacco: Never  Substance Use Topics   Alcohol use: Yes    Comment: Socially    Drug use: Not Currently    Home Medications Prior to Admission medications   Medication Sig Start Date End Date Taking? Authorizing Provider  atorvastatin (LIPITOR) 80 MG tablet  Take 80 mg by mouth daily.    [provider]  famotidine (PEPCID) 20 MG tablet Take 1 tablet (20 mg total) by mouth 2 (two) times daily. 01/02/21   Wallis Bamberg, PA-C  glipiZIDE (GLUCOTROL) 10 MG tablet Take 5 mg by mouth daily before breakfast.    [provider]  insulin glargine (LANTUS) 100 UNIT/ML injection Inject 35 Units into the skin daily.    [provider]  mupirocin ointment (BACTROBAN) 2 % Apply 1 application topically 2 (two) times daily. 03/15/20   Yu, Amy V, PA-C  RYBELSUS 3 MG TABS Take 1 tablet by mouth at bedtime. 06/21/20   [provider]    Allergies    Patient has no known allergies.  Review of Systems   Review of Systems  All other systems reviewed and are negative.  Physical Exam Updated Vital Signs BP (!) 131/91 (BP Location: Right Arm)   Pulse 94   Temp 98.1 F (36.7 C) (Oral)   Resp 18   SpO2 100%   Physical Exam Vitals and nursing note reviewed.  Constitutional:      General: He is not in acute distress.    Appearance: He is well-developed. He is obese.  HENT:     Head: Atraumatic.  Eyes:     Conjunctiva/sclera: Conjunctivae normal.  Cardiovascular:     Rate and  Rhythm: Normal rate and regular rhythm.     Pulses: Normal pulses.     Heart sounds: Normal heart sounds.  Pulmonary:     Breath sounds: Normal breath sounds. No wheezing or rhonchi.  Chest:     Chest wall: No tenderness.  Abdominal:     Palpations: Abdomen is soft.     Tenderness: There is no abdominal tenderness.  Musculoskeletal:     Cervical back: Normal range of motion and neck supple. No rigidity.     Right lower leg: No edema.     Left lower leg: No edema.  Skin:    Findings: No rash.  Neurological:     Mental Status: He is alert. Mental status is at baseline.  Psychiatric:        Mood and Affect: Mood normal.    ED Results / Procedures / Treatments   Labs (all labs ordered are listed, but only abnormal results are displayed) Labs  Reviewed  BASIC METABOLIC PANEL - Abnormal; Notable for the following components:      Result Value   Sodium 131 (*)    Chloride 97 (*)    Glucose, Bld 362 (*)    All other components within normal limits  CBG MONITORING, ED - Abnormal; Notable for the following components:   Glucose-Capillary 485 (*)    All other components within normal limits  CBG MONITORING, ED - Abnormal; Notable for the following components:   Glucose-Capillary 339 (*)    All other components within normal limits  CBC WITH DIFFERENTIAL/PLATELET  TROPONIN I (HIGH SENSITIVITY)  TROPONIN I (HIGH SENSITIVITY)    EKG None ED ECG REPORT   Date: 06/23/2021  Rate: 106  Rhythm: sinus tachycardia  QRS Axis: normal  Intervals: normal  ST/T Wave abnormalities: nonspecific T wave changes  Conduction Disutrbances:none  Narrative Interpretation:   Old EKG Reviewed: unchanged  I have personally reviewed the EKG tracing and agree with the computerized printout as noted.   Radiology DG Chest 2 View  Result Date: 06/23/2021 CLINICAL DATA:  Acute chest pain for 1 day EXAM: CHEST - 2 VIEW COMPARISON:  None. FINDINGS: The cardiomediastinal silhouette is unremarkable. There is no evidence of focal airspace disease, pulmonary edema, suspicious pulmonary nodule/mass, pleural effusion, or pneumothorax. No acute bony abnormalities are identified. IMPRESSION: No active cardiopulmonary disease. Electronically Signed   By: Harmon Pier M.D.   On: 06/23/2021 10:36    Procedures Procedures   Medications Ordered in ED Medications  insulin aspart (novoLOG) injection 5 Units (5 Units Subcutaneous Given 06/23/21 1353)  sodium chloride 0.9 % bolus 1,000 mL (1,000 mLs Intravenous New Bag/Given 06/23/21 1429)    ED Course  I have reviewed the triage vital signs and the nursing notes.  Pertinent labs & imaging results that were available during my care of the patient were reviewed by me and considered in my medical decision making (see  chart for details).    MDM Rules/Calculators/A&P                          BP (!) 131/91 (BP Location: Right Arm)   Pulse 94   Temp 98.1 F (36.7 C) (Oral)   Resp 18   SpO2 100%   Final Clinical Impression(s) / ED Diagnoses Final diagnoses:  Atypical chest pain  Hyperglycemia    Rx / DC Orders ED Discharge Orders          Ordered    pantoprazole (PROTONIX) 20  MG tablet  Daily        06/23/21 1540    insulin glargine (LANTUS) 100 UNIT/ML injection  Daily        06/23/21 1540    glipiZIDE (GLUCOTROL) 10 MG tablet  Daily before breakfast        06/23/21 1540           Patient here with right-sided chest discomfort, it has been a recurrent issue for the past week.  No significant pain at this time.  Patient overall well-appearing.  Does have history of diabetes not well controlled due to medication noncompliance.  CBG today is 362.  The remainder of his labs are reassuring.  Chest x-ray and EKG without concerning finding.  2:18 PM On recheck CBG is 480 patient likely benefit from some IV fluid for better control of his hyperglycemic state.  Labs not consistent with DKA.  Improvement of CBG after receiving insulin and IV fluid.  Or represcribe his diabetic medication encourage patient to follow-up closely with PCP for further care.  Return precaution given.  Patient otherwise stable for discharge.  Chest pain atypical of ACS.  Suspect GERD, will prescribe PPI.  Return precaution given   Fayrene Helper, PA-C 06/23/21 1609    Derwood Kaplan, MD 06/25/21 [phone]

## 2021-07-01 ENCOUNTER — Encounter (HOSPITAL_COMMUNITY): Payer: Self-pay

## 2021-07-01 ENCOUNTER — Ambulatory Visit (INDEPENDENT_AMBULATORY_CARE_PROVIDER_SITE_OTHER): Payer: 59

## 2021-07-01 ENCOUNTER — Ambulatory Visit (HOSPITAL_COMMUNITY)
Admission: EM | Admit: 2021-07-01 | Discharge: 2021-07-01 | Disposition: A | Discharge status: Home / Self Care | Payer: 59

## 2021-07-01 DIAGNOSIS — M25571 Pain in right ankle and joints of right foot: Secondary | ICD-10-CM | POA: Diagnosis not present

## 2021-07-01 DIAGNOSIS — S92351A Displaced fracture of fifth metatarsal bone, right foot, initial encounter for closed fracture: Secondary | ICD-10-CM | POA: Diagnosis not present

## 2021-07-01 MED ORDER — COLCHICINE 0.6 MG PO TABS: 1.2000 mg | ORAL_TABLET | Freq: Every day | ORAL | 0 refills | Status: DC
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
## 2021-07-01 NOTE — Progress Notes (Signed)
Orthopedic Tech Progress Note Patient Details:  [name] 12-07-1996 [id number] Applied short leg splint to RLE. Provided crutch education. Ortho Devices Type of Ortho Device: Short leg splint Ortho Device/Splint Location: RLE Ortho Device/Splint Interventions: Ordered, Application   Post Interventions Patient Tolerated: Well Instructions Provided: Care of device  Kerry Fort 07/01/2021, 9:01 PM
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
## 2021-07-01 NOTE — Discharge Instructions (Addendum)
You have a right foot fracture.  Your foot will be splinted.  You will need to follow-up with orthopedics with provided contact information tomorrow for further evaluation and management.  You may take ibuprofen over-the-counter as needed for pain and inflammation.  Please use crutches and do not weight-bear on right foot.  May use ice pack application to affected area.

## 2021-09-13 ENCOUNTER — Encounter (HOSPITAL_COMMUNITY): Payer: Self-pay | Admitting: Emergency Medicine

## 2021-09-13 ENCOUNTER — Ambulatory Visit (INDEPENDENT_AMBULATORY_CARE_PROVIDER_SITE_OTHER): Payer: 59

## 2021-09-13 ENCOUNTER — Other Ambulatory Visit: Payer: Self-pay

## 2021-09-13 ENCOUNTER — Ambulatory Visit (HOSPITAL_COMMUNITY)
Admission: EM | Admit: 2021-09-13 | Discharge: 2021-09-13 | Disposition: A | Discharge status: Home / Self Care | Payer: 59 | Attending: Physician Assistant | Admitting: Physician Assistant

## 2021-09-13 DIAGNOSIS — T148XXA Other injury of unspecified body region, initial encounter: Secondary | ICD-10-CM | POA: Insufficient documentation

## 2021-09-13 DIAGNOSIS — L03114 Cellulitis of left upper limb: Secondary | ICD-10-CM | POA: Diagnosis not present

## 2021-09-13 DIAGNOSIS — L089 Local infection of the skin and subcutaneous tissue, unspecified: Secondary | ICD-10-CM

## 2021-09-13 DIAGNOSIS — T1490XA Injury, unspecified, initial encounter: Secondary | ICD-10-CM

## 2021-09-13 LAB — COMPREHENSIVE METABOLIC PANEL
ALT: 10 U/L (ref 0–44)
AST: 12 U/L — ABNORMAL LOW (ref 15–41)
Albumin: 3.3 g/dL — ABNORMAL LOW (ref 3.5–5.0)
Alkaline Phosphatase: 84 U/L (ref 38–126)
Anion gap: 9 (ref 5–15)
BUN: 13 mg/dL (ref 6–20)
CO2: 26 mmol/L (ref 22–32)
Calcium: 9.2 mg/dL (ref 8.9–10.3)
Chloride: 93 mmol/L — ABNORMAL LOW (ref 98–111)
Creatinine, Ser: 1.24 mg/dL (ref 0.61–1.24)
GFR, Estimated: >60 mL/min (ref >60)
Glucose, Bld: 426 mg/dL — ABNORMAL HIGH (ref 70–99)
Potassium: 4.5 mmol/L (ref 3.5–5.1)
Sodium: 128 mmol/L — ABNORMAL LOW (ref 135–145)
Total Bilirubin: 1.2 mg/dL (ref 0.3–1.2)
Total Protein: 7.2 g/dL (ref 6.5–8.1)

## 2021-09-13 LAB — CBC WITH DIFFERENTIAL/PLATELET
Abs Immature Granulocytes: 0.03 10*3/uL (ref 0.00–0.07)
Basophils Absolute: 0 10*3/uL (ref 0.0–0.1)
Basophils Relative: 0 %
Eosinophils Absolute: 0 10*3/uL (ref 0.0–0.5)
Eosinophils Relative: 0 %
HCT: 44.1 % (ref 39.0–52.0)
Hemoglobin: 15 g/dL (ref 13.0–17.0)
Immature Granulocytes: 0 %
Lymphocytes Relative: 12 %
Lymphs Abs: 1.6 10*3/uL (ref 0.7–4.0)
MCH: 28.7 pg (ref 26.0–34.0)
MCHC: 34 g/dL (ref 30.0–36.0)
MCV: 84.3 fL (ref 80.0–100.0)
Monocytes Absolute: 0.6 10*3/uL (ref 0.1–1.0)
Monocytes Relative: 5 %
Neutro Abs: 10.9 10*3/uL — ABNORMAL HIGH (ref 1.7–7.7)
Neutrophils Relative %: 83 %
Platelets: 241 10*3/uL (ref 150–400)
RBC: 5.23 MIL/uL (ref 4.22–5.81)
RDW: 12.2 % (ref 11.5–15.5)
WBC: 13.2 10*3/uL — ABNORMAL HIGH (ref 4.0–10.5)
nRBC: 0 % (ref 0.0–0.2)

## 2021-09-13 MED ORDER — DOXYCYCLINE HYCLATE 100 MG PO CAPS: 100.0000 mg | ORAL_CAPSULE | Freq: Two times a day (BID) | ORAL | 0 refills | Status: DC

## 2021-09-13 MED ORDER — MUPIROCIN 2 % EX OINT: 1.0000 "application " | TOPICAL_OINTMENT | Freq: Two times a day (BID) | CUTANEOUS | 0 refills | Status: AC

## 2021-09-13 MED ORDER — CEFTRIAXONE SODIUM 1 G IJ SOLR
INTRAMUSCULAR | Status: AC
  Filled 2021-09-13: qty 10

## 2021-09-13 MED ORDER — CEFTRIAXONE SODIUM 1 G IJ SOLR
1.0000 g | Freq: Once | INTRAMUSCULAR | Status: AC
  Administered 2021-09-13: 1 g via INTRAMUSCULAR

## 2021-09-13 NOTE — ED Triage Notes (Signed)
Pt is present today that he noticed a bump on his left forearm Sunday. Pt states that he did scratch and noticed that it was getting worse. Pt states that he is experiencing a pain that is radiating his arm.

## 2021-09-13 NOTE — Discharge Instructions (Signed)
Your x-ray was normal but if your symptoms are not improving very quickly with antibiotics you are prescribed please follow-up with PCP or ER as you may need more advanced imaging such as MRI.  I do think you should see a surgeon as you will likely need to have that central region debrided as we discussed.  Take doxycycline 100 mg twice daily for 10 days.  This can make you sensitive to the sun so stay out of the sun while on it.  Use Bactroban dry dressing changes.  If you have any systemic symptoms including fever, increased pain, nausea, vomiting you need to go to the emergency room as we discussed.  Follow-up with either our clinic or your primary care provider within 48 hours to ensure symptom improvement.

## 2021-09-13 NOTE — ED Provider Notes (Signed)
[facility]    CSN: [id number] Arrival date & time: 09/13/21  8315      History   Chief Complaint Chief Complaint  Patient presents with   Rash    Left forearm    HPI [name] is a 25 y.o. male.   Patient presents today with a weeklong history of wounds on his right forearm that has gradually been worsening.  He reports area began as a pruritic papule that he was scratching and then developed into wound with surrounding redness and pain.  Reports pain is rated 9 on a 0-10 pain scale, generalized throughout left forearm but centralized at wound, described as intense aching/throbbing, no aggravating or alleviating factors identified.  Patient has been keeping area covered but has not been cleaning it with any particular product or applying topical antibiotic ointment.  He denies history of recurrent skin infections or history of MRSA.  He does have a history of diabetes and reports blood sugars are poorly controlled.  He denies additional symptoms including fever, body aches, nausea, vomiting.  He denies any known insect or spider bite.  He is left-handed.   Past Medical History:  Diagnosis Date   Diabetes mellitus without complication (HCC)    High cholesterol     There are no problems to display for this patient.   History reviewed. No pertinent surgical history.     Home Medications    Prior to Admission medications   Medication Sig Start Date End Date Taking? Authorizing Provider  doxycycline (VIBRAMYCIN) 100 MG capsule Take 1 capsule (100 mg total) by mouth 2 (two) times daily. 09/13/21  Yes Jazmin Ley K, PA-C  atorvastatin (LIPITOR) 80 MG tablet Take 80 mg by mouth daily.    [provider]  famotidine (PEPCID) 20 MG tablet Take 1 tablet (20 mg total) by mouth 2 (two) times daily. 01/02/21   Wallis Bamberg, PA-C  glipiZIDE (GLUCOTROL) 10 MG tablet Take 0.5 tablets (5 mg total) by mouth daily before breakfast. 06/23/21   Fayrene Helper, PA-C   insulin glargine (LANTUS) 100 UNIT/ML injection Inject 0.35 mLs (35 Units total) into the skin daily. 06/23/21   Fayrene Helper, PA-C  mupirocin ointment (BACTROBAN) 2 % Apply 1 application topically 2 (two) times daily. 09/13/21   Jacobb Alen, Noberto Retort, PA-C  Omeprazole Magnesium (PRILOSEC PO) Take by mouth.    [provider]  pantoprazole (PROTONIX) 20 MG tablet Take 1 tablet (20 mg total) by mouth daily. 06/23/21   Fayrene Helper, PA-C  RYBELSUS 3 MG TABS Take 1 tablet by mouth at bedtime. 06/21/20   [provider]    Family History Family History  Problem Relation Age of Onset   Diabetes Mother    Hypertension Mother     Social History Social History   Tobacco Use   Smoking status: Never   Smokeless tobacco: Never  Substance Use Topics   Alcohol use: Yes    Comment: Socially    Drug use: Not Currently     Allergies   Patient has no known allergies.   Review of Systems Review of Systems  Constitutional:  Positive for activity change. Negative for appetite change, fatigue and fever.  Respiratory:  Negative for cough and shortness of breath.   Cardiovascular:  Negative for chest pain.  Gastrointestinal:  Negative for diarrhea, nausea and vomiting.  Musculoskeletal:  Positive for myalgias. Negative for arthralgias.  Skin:  Positive for color change and wound.  Neurological:  Negative for dizziness, weakness,  light-headedness, numbness and headaches.    Physical Exam Triage Vital Signs ED Triage Vitals  Enc Vitals Group     BP 09/13/21 0830 (!) 148/86     Pulse Rate 09/13/21 0830 (!) 105     Resp 09/13/21 0830 18     Temp 09/13/21 0830 99.6 F (37.6 C)     Temp src --      SpO2 09/13/21 0830 95 %     Weight --      Height --      Head Circumference --      Peak Flow --      Pain Score 09/13/21 0833 9     Pain Loc --      Pain Edu? --      Excl. in GC? --    No data found.  Updated Vital Signs BP (!) 161/104   Pulse (!) 103   Temp 99.6 F (37.6 C)    Resp 18   SpO2 95%   Visual Acuity Right Eye Distance:   Left Eye Distance:   Bilateral Distance:    Right Eye Near:   Left Eye Near:    Bilateral Near:     Physical Exam Vitals reviewed.  Constitutional:      General: He is awake.     Appearance: Normal appearance. He is normal weight. He is not ill-appearing.     Comments: Very pleasant male appears stated age in no acute distress sitting comfortably in exam room  HENT:     Head: Normocephalic and atraumatic.     Mouth/Throat:     Pharynx: Uvula midline. No oropharyngeal exudate or posterior oropharyngeal erythema.  Cardiovascular:     Rate and Rhythm: Normal rate and regular rhythm.     Pulses:          Radial pulses are 2+ on the right side and 2+ on the left side.     Heart sounds: Normal heart sounds, S1 normal and S2 normal. No murmur heard.    Comments: Capillary refill within 2 seconds bilateral fingers Pulmonary:     Effort: Pulmonary effort is normal.     Breath sounds: Normal breath sounds. No stridor. No wheezing, rhonchi or rales.     Comments: Clear to auscultation bilaterally Skin:    Findings: Erythema and wound present.     Comments: 3 cm x 2 cm wound with 1 cm central eschar.  No streaking or evidence of lymphangitis.  Surrounding erythema with associated edema.  Serosanguineous and purulent drainage noted from wound.  Neurological:     Mental Status: He is alert.  Psychiatric:        Behavior: Behavior is cooperative.       UC Treatments / Results  Labs (all labs ordered are listed, but only abnormal results are displayed) Labs Reviewed  CBC WITH DIFFERENTIAL/PLATELET - Abnormal; Notable for the following components:      Result Value   WBC 13.2 (*)    Neutro Abs 10.9 (*)    All other components within normal limits  COMPREHENSIVE METABOLIC PANEL    EKG   Radiology DG Forearm Left  Result Date: 09/13/2021 CLINICAL DATA:  Wound, rule out osteomyelitis EXAM: LEFT FOREARM - 2 VIEW  COMPARISON:  None. FINDINGS: There is no evidence of fracture or other focal bone lesions. Soft tissue edema about the medial forearm. IMPRESSION: No fracture or dislocation of the left forearm. No radiographic findings to suggest osteomyelitis. Soft tissue edema about the medial  forearm. MRI is the most sensitive test for the detection of bone marrow edema and osteomyelitis if clinically suspected. Electronically Signed   By: Lauralyn Primes M.D.   On: 09/13/2021 09:41    Procedures Procedures (including critical care time)  Medications Ordered in UC Medications  cefTRIAXone (ROCEPHIN) injection 1 g (1 g Intramuscular Given 09/13/21 0937)    Initial Impression / Assessment and Plan / UC Course  I have reviewed the triage vital signs and the nursing notes.  Pertinent labs & imaging results that were available during my care of the patient were reviewed by me and considered in my medical decision making (see chart for details).      X-ray obtained given history of diabetes to ensure no osteomyelitis given central eschar.  X-ray was normal with only soft tissue swelling.  CBC and CMP obtained today-results pending.  Patient was started on antibiotics including doxycycline and Bactroban.  Discussed that based on clinical presentation he may need debridement and so gave him the contact information for surgeons office and recommend he follow-up with them as soon as possible.  Discussed that someone will need to reevaluate this wound within 48 hours and if he is unable to see surgeon or PCP in this timeframe he needs to return to our clinic.  Discussed alarm symptoms that warrant emergent evaluation.  Strict return precautions given to which she expressed understanding.  Final Clinical Impressions(s) / UC Diagnoses   Final diagnoses:  Wound infection  Cellulitis of left upper extremity     Discharge Instructions      Your x-ray was normal but if your symptoms are not improving very quickly with  antibiotics you are prescribed please follow-up with PCP or ER as you may need more advanced imaging such as MRI.  I do think you should see a surgeon as you will likely need to have that central region debrided as we discussed.  Take doxycycline 100 mg twice daily for 10 days.  This can make you sensitive to the sun so stay out of the sun while on it.  Use Bactroban dry dressing changes.  If you have any systemic symptoms including fever, increased pain, nausea, vomiting you need to go to the emergency room as we discussed.  Follow-up with either our clinic or your primary care provider within 48 hours to ensure symptom improvement.     ED Prescriptions     Medication Sig Dispense Auth. Provider   mupirocin ointment (BACTROBAN) 2 % Apply 1 application topically 2 (two) times daily. 22 g Md Smola K, PA-C   doxycycline (VIBRAMYCIN) 100 MG capsule Take 1 capsule (100 mg total) by mouth 2 (two) times daily. 20 capsule Sadeen Wiegel, Noberto Retort, PA-C      PDMP not reviewed this encounter.   Jeani Hawking, PA-C 09/13/21 1012

## 2021-09-16 ENCOUNTER — Other Ambulatory Visit: Payer: Self-pay

## 2021-09-16 ENCOUNTER — Ambulatory Visit (HOSPITAL_COMMUNITY)
Admission: EM | Admit: 2021-09-16 | Discharge: 2021-09-16 | Disposition: A | Discharge status: Home / Self Care | Payer: 59 | Attending: Emergency Medicine | Admitting: Emergency Medicine

## 2021-09-16 ENCOUNTER — Encounter (HOSPITAL_COMMUNITY): Payer: Self-pay | Admitting: Emergency Medicine

## 2021-09-16 DIAGNOSIS — L089 Local infection of the skin and subcutaneous tissue, unspecified: Secondary | ICD-10-CM | POA: Diagnosis not present

## 2021-09-16 DIAGNOSIS — Z5189 Encounter for other specified aftercare: Secondary | ICD-10-CM | POA: Diagnosis not present

## 2021-09-16 NOTE — ED Triage Notes (Signed)
Pt presents for follow-up on left arm. States has had Decreased swelling and pain.

## 2021-09-16 NOTE — ED Provider Notes (Signed)
[facility]    CSN: [id number] Arrival date & time: 09/16/21  7062      History   Chief Complaint Chief Complaint  Patient presents with   Follow-up    HPI [name] is a 25 y.o. male history of DM type II, presenting today for wound check.  Was seen here 3 days ago for wound to his left forearm.  At the time received IM Rocephin, initiated on doxycycline, reports improvement in pain and swelling since, has had some pustular drainage.  Denies any fevers.  Has been cleansing wound with hydrogen peroxide twice daily and also applying mupirocin ointment.  HPI  Past Medical History:  Diagnosis Date   Diabetes mellitus without complication (HCC)    High cholesterol     There are no problems to display for this patient.   History reviewed. No pertinent surgical history.     Home Medications    Prior to Admission medications   Medication Sig Start Date End Date Taking? Authorizing Provider  atorvastatin (LIPITOR) 80 MG tablet Take 80 mg by mouth daily.    [provider]  doxycycline (VIBRAMYCIN) 100 MG capsule Take 1 capsule (100 mg total) by mouth 2 (two) times daily. 09/13/21   Raspet, Noberto Retort, PA-C  famotidine (PEPCID) 20 MG tablet Take 1 tablet (20 mg total) by mouth 2 (two) times daily. 01/02/21   Wallis Bamberg, PA-C  glipiZIDE (GLUCOTROL) 10 MG tablet Take 0.5 tablets (5 mg total) by mouth daily before breakfast. 06/23/21   Fayrene Helper, PA-C  insulin glargine (LANTUS) 100 UNIT/ML injection Inject 0.35 mLs (35 Units total) into the skin daily. 06/23/21   Fayrene Helper, PA-C  mupirocin ointment (BACTROBAN) 2 % Apply 1 application topically 2 (two) times daily. 09/13/21   Raspet, Noberto Retort, PA-C  Omeprazole Magnesium (PRILOSEC PO) Take by mouth.    [provider]  pantoprazole (PROTONIX) 20 MG tablet Take 1 tablet (20 mg total) by mouth daily. 06/23/21   Fayrene Helper, PA-C  RYBELSUS 3 MG TABS Take 1 tablet by mouth at bedtime. 06/21/20   [provider]    Family History Family History  Problem Relation Age of Onset   Diabetes Mother    Hypertension Mother     Social History Social History   Tobacco Use   Smoking status: Never   Smokeless tobacco: Never  Substance Use Topics   Alcohol use: Yes    Comment: Socially    Drug use: Not Currently     Allergies   Patient has no known allergies.   Review of Systems Review of Systems  Constitutional:  Negative for fatigue and fever.  Eyes:  Negative for redness, itching and visual disturbance.  Respiratory:  Negative for shortness of breath.   Cardiovascular:  Negative for chest pain and leg swelling.  Gastrointestinal:  Negative for nausea and vomiting.  Musculoskeletal:  Negative for arthralgias and myalgias.  Skin:  Positive for color change and wound. Negative for rash.  Neurological:  Negative for dizziness, syncope, weakness, light-headedness and headaches.    Physical Exam Triage Vital Signs ED Triage Vitals  Enc Vitals Group     BP 09/16/21 0842 137/89     Pulse Rate 09/16/21 0842 96     Resp 09/16/21 0842 17     Temp 09/16/21 0842 98.4 F (36.9 C)     Temp Source 09/16/21 0842 Oral     SpO2 09/16/21 0842 96 %     Weight --  Height --      Head Circumference --      Peak Flow --      Pain Score 09/16/21 0840 0     Pain Loc --      Pain Edu? --      Excl. in GC? --    No data found.  Updated Vital Signs BP 137/89 (BP Location: Right Arm)   Pulse 96   Temp 98.4 F (36.9 C) (Oral)   Resp 17   SpO2 96%   Visual Acuity Right Eye Distance:   Left Eye Distance:   Bilateral Distance:    Right Eye Near:   Left Eye Near:    Bilateral Near:     Physical Exam Vitals and nursing note reviewed.  Constitutional:      Appearance: He is well-developed.     Comments: No acute distress  HENT:     Head: Normocephalic and atraumatic.     Nose: Nose normal.  Eyes:     Conjunctiva/sclera: Conjunctivae normal.  Cardiovascular:      Rate and Rhythm: Normal rate.  Pulmonary:     Effort: Pulmonary effort is normal. No respiratory distress.  Abdominal:     General: There is no distension.  Musculoskeletal:        General: Normal range of motion.     Cervical back: Neck supple.  Skin:    General: Skin is warm and dry.     Comments: Left forearm with healing wound/abscess-central yellowish eschar present with surrounding exposed erythema, skin slightly sloughing around wound, pustular drainage from central area, swelling improved from prior visit based off prior picture  Neurological:     Mental Status: He is alert and oriented to person, place, and time.     UC Treatments / Results  Labs (all labs ordered are listed, but only abnormal results are displayed) Labs Reviewed - No data to display  EKG   Radiology No results found.  Procedures Procedures (including critical care time)  Medications Ordered in UC Medications - No data to display  Initial Impression / Assessment and Plan / UC Course  I have reviewed the triage vital signs and the nursing notes.  Pertinent labs & imaging results that were available during my care of the patient were reviewed by me and considered in my medical decision making (see chart for details).     Left forearm abscess/wound-appears to be improving/infection resolving-recommended to continue doxycycline, discussed wound care, avoid hydrogen peroxide, will defer further topical antibiotic ointment to limit moisture contributing to skin sloughing, referral to wound center placed in case as sure not resolving for possible need for debridement.  Discussed strict return precautions. Patient verbalized understanding and is agreeable with plan.  Final Clinical Impressions(s) / UC Diagnoses   Final diagnoses:  Wound check, abscess     Discharge Instructions      Complete course of doxycycline May apply warm compresses/heat to area to further help clear infection Gentle  massage to express further drainage Cleanse wound with warm soapy water and dry well afterwards Avoid moisture from sitting on wound Referral placed to wound center Follow-up if not improving or worsening     ED Prescriptions   None    PDMP not reviewed this encounter.   Lew Dawes, [place] 09/16/21 [phone]

## 2021-09-16 NOTE — Discharge Instructions (Signed)
Complete course of doxycycline May apply warm compresses/heat to area to further help clear infection Gentle massage to express further drainage Cleanse wound with warm soapy water and dry well afterwards Avoid moisture from sitting on wound Referral placed to wound center Follow-up if not improving or worsening

## 2021-10-02 ENCOUNTER — Other Ambulatory Visit: Payer: Self-pay

## 2021-10-02 ENCOUNTER — Encounter: Payer: 59 | Attending: Internal Medicine | Admitting: Internal Medicine

## 2021-10-02 DIAGNOSIS — E119 Type 2 diabetes mellitus without complications: Secondary | ICD-10-CM | POA: Diagnosis not present

## 2021-10-02 DIAGNOSIS — Z794 Long term (current) use of insulin: Secondary | ICD-10-CM | POA: Insufficient documentation

## 2021-10-02 DIAGNOSIS — S41102A Unspecified open wound of left upper arm, initial encounter: Secondary | ICD-10-CM

## 2021-10-02 DIAGNOSIS — X58XXXD Exposure to other specified factors, subsequent encounter: Secondary | ICD-10-CM | POA: Diagnosis not present

## 2021-10-02 DIAGNOSIS — S41102D Unspecified open wound of left upper arm, subsequent encounter: Secondary | ICD-10-CM | POA: Insufficient documentation

## 2021-10-03 NOTE — Progress Notes (Signed)
Greg, Manning ([id number]) Visit Report for 10/02/2021 Chief Complaint Document Details Patient Name: [name], [name]. Date of Service: 10/02/2021 8:45 AM Medical Record Number: [id number] Patient Account Number: [id number] Date of Birth/Sex: 02/09/1996 (25 y.o. M) Treating RN: Yevonne Pax Primary Care Provider: Karsten Fells Other Clinician: Referring Provider: Patterson Hammersmith Treating Provider/Extender: Tilda Franco in Treatment: 0 Information Obtained from: Patient Chief Complaint Left arm wound Electronic Signature(s) Signed: 10/02/2021 10:44:51 AM By: Geralyn Corwin DO Entered By: Geralyn Corwin on 10/02/2021 10:28:54 [name] ([id number]) -------------------------------------------------------------------------------- Debridement Details Patient Name: [name] Date of Service: 10/02/2021 8:45 AM Medical Record Number: [id number] Patient Account Number: [id number] Date of Birth/Sex: 1996-01-15 (25 y.o. M) Treating RN: Yevonne Pax Primary Care Provider: Karsten Fells Other Clinician: Referring Provider: Patterson Hammersmith Treating Provider/Extender: Tilda Franco in Treatment: 0 Debridement Performed for Wound #1 Left,Anterior Forearm Assessment: Performed By: Physician Geralyn Corwin, MD Debridement Type: Chemical/Enzymatic/Mechanical Agent Used: Santyl Level of Consciousness (Pre- Awake and Alert procedure): Pre-procedure Verification/Time Out Yes - 09:23 Taken: Start Time: 09:23 Pain Control: Lidocaine 4% Topical Solution Instrument: Other : santyl Bleeding: None End Time: 09:24 Procedural Pain: 0 Post Procedural Pain: 0 Response to Treatment: Procedure was tolerated well Level of Consciousness (Post- Awake and Alert procedure): Post Debridement Measurements of Total Wound Length: (cm) 1 Width: (cm) 1.3 Depth: (cm) 0.2 Volume: (cm) 0.204 Character of Wound/Ulcer Post Debridement: Improved Post Procedure  Diagnosis Same as Pre-procedure Electronic Signature(s) Signed: 10/02/2021 10:44:51 AM By: Geralyn Corwin DO Signed: 10/03/2021 1:01:44 PM By: Yevonne Pax RN Entered By: Yevonne Pax on 10/02/2021 09:24:47 [place], Valma Cava ([id number]) -------------------------------------------------------------------------------- HPI Details Patient Name: [name] Date of Service: 10/02/2021 8:45 AM Medical Record Number: [id number] Patient Account Number: [id number] Date of Birth/Sex: 1996/04/02 (25 y.o. M) Treating RN: Yevonne Pax Primary Care Provider: Karsten Fells Other Clinician: Referring Provider: Patterson Hammersmith Treating Provider/Extender: Tilda Franco in Treatment: 0 History of Present Illness HPI Description: Admission 10/02/2021 Mr. [name] is a 25 year old male with a past medical history of uncontrolled insulin-dependent type 2 diabetes that presents to the clinic for a left arm wound. He states this started out as a raised lesion that he scratched and became infected. He was evaluated by urgent care on 09/13/2021 and recommended using mupirocin cream and was placed on doxycycline. He subsequently followed up 3 days later for wound check. He was told to place a dry dressing on this and follow-up with wound care. He reports stability in wound healing. He denies current signs of infection. He states that his blood glucose levels are high with a hemoglobin A1c of 14. He currently feels well. Electronic Signature(s) Signed: 10/02/2021 10:44:51 AM By: Geralyn Corwin DO Entered By: Geralyn Corwin on 10/02/2021 10:38:12 [place], Valma Cava ([id number]) -------------------------------------------------------------------------------- Physical Exam Details Patient Name: [name] Date of Service: 10/02/2021 8:45 AM Medical Record Number: [id number] Patient Account Number: [id number] Date of Birth/Sex: May 06, 1996 (25 y.o. M) Treating RN: Yevonne Pax Primary  Care Provider: Karsten Fells Other Clinician: Referring Provider: Patterson Hammersmith Treating Provider/Extender: Tilda Franco in Treatment: 0 Constitutional . Psychiatric . Notes Left arm: To the forearm there is a circular open wound with nonviable tissue in the center that is tightly adhered. Along the rim there is granulation tissue forming. No drainage noted. No obvious signs of surrounding infection. Electronic Signature(s) Signed: 10/02/2021 10:44:51 AM By: Geralyn Corwin DO Entered By: Geralyn Corwin on 10/02/2021 10:39:08 [name] ([id number]) -------------------------------------------------------------------------------- Physician Orders Details Patient Name:  Manning, Greg J. Date of Service: 10/02/2021 8:45 AM Medical Record Number: [id number] Patient Account Number: [id number] Date of Birth/Sex: 1996/12/17 (25 y.o. M) Treating RN: Yevonne Pax Primary Care Provider: Karsten Fells Other Clinician: Referring Provider: Patterson Hammersmith Treating Provider/Extender: Tilda Franco in Treatment: 0 Verbal / Phone Orders: No Diagnosis Coding ICD-10 Coding Code Description S41.102A Unspecified open wound of left upper arm, initial encounter E11.9 Type 2 diabetes mellitus without complications Follow-up Appointments o Return Appointment in 1 week. Bathing/ Shower/ Hygiene o May shower; gently cleanse wound with antibacterial soap, rinse and pat dry prior to dressing wounds Wound Treatment Wound #1 - Forearm Wound Laterality: Left, Anterior Primary Dressing: Hydrofera Blue Ready Transfer Foam, 2.5x2.5 (in/in) (DME) (Generic) Every Other Day/30 Days Discharge Instructions: Apply Hydrofera Blue Ready to wound bed as directed Primary Dressing: Santyl Collagenase Ointment, 30 (gm), tube Every Other Day/30 Days Secondary Dressing: Zetuvit Plus Silicone Border Dressing 4x4 (in/in) (DME) (Generic) Every Other Day/30 Days Electronic  Signature(s) Signed: 10/02/2021 10:44:51 AM By: Geralyn Corwin DO Signed: 10/03/2021 1:01:44 PM By: Yevonne Pax RN Entered By: Yevonne Pax on 10/02/2021 09:27:07 [place], Valma Cava ([id number]) -------------------------------------------------------------------------------- Problem List Details Patient Name: [name] Date of Service: 10/02/2021 8:45 AM Medical Record Number: [id number] Patient Account Number: [id number] Date of Birth/Sex: Dec 28, 1995 (25 y.o. M) Treating RN: Yevonne Pax Primary Care Provider: Karsten Fells Other Clinician: Referring Provider: Patterson Hammersmith Treating Provider/Extender: Tilda Franco in Treatment: 0 Active Problems ICD-10 Encounter Code Description Active Date MDM Diagnosis S41.102A Unspecified open wound of left upper arm, initial encounter 10/02/2021 No Yes E11.9 Type 2 diabetes mellitus without complications 10/02/2021 No Yes Inactive Problems Resolved Problems Electronic Signature(s) Signed: 10/02/2021 10:44:51 AM By: Geralyn Corwin DO Entered By: Geralyn Corwin on 10/02/2021 10:25:36 [place], Valma Cava ([id number]) -------------------------------------------------------------------------------- Progress Note Details Patient Name: [name] Date of Service: 10/02/2021 8:45 AM Medical Record Number: [id number] Patient Account Number: [id number] Date of Birth/Sex: 10/03/1996 (25 y.o. M) Treating RN: Yevonne Pax Primary Care Provider: Karsten Fells Other Clinician: Referring Provider: Patterson Hammersmith Treating Provider/Extender: Tilda Franco in Treatment: 0 Subjective Chief Complaint Information obtained from Patient Left arm wound History of Present Illness (HPI) Admission 10/02/2021 Mr. Jamien Casanova is a 26 year old male with a past medical history of uncontrolled insulin-dependent type 2 diabetes that presents to the clinic for a left arm wound. He states this started out as a raised lesion that  he scratched and became infected. He was evaluated by urgent care on 09/13/2021 and recommended using mupirocin cream and was placed on doxycycline. He subsequently followed up 3 days later for wound check. He was told to place a dry dressing on this and follow-up with wound care. He reports stability in wound healing. He denies current signs of infection. He states that his blood glucose levels are high with a hemoglobin A1c of 14. He currently feels well. Patient History Information obtained from Patient. Allergies No Known Allergies Social History Alcohol Use - Rarely, Drug Use - No History, Caffeine Use - Rarely. Medical History Endocrine Patient has history of Type II Diabetes Patient is treated with Insulin. Blood sugar is not tested. Review of Systems (ROS) Integumentary (Skin) Complains or has symptoms of Wounds. Objective Constitutional Vitals Time Taken: 8:56 AM, Height: 70 in, Source: Stated, Weight: 230 lbs, Source: Measured, BMI: 33, Temperature: 98.9 F, Pulse: 99 bpm, Respiratory Rate: 18 breaths/min, Blood Pressure: 149/90 mmHg. General Notes: Left arm: To the forearm there is a circular open wound with nonviable  tissue in the center that is tightly adhered. Along the rim there is granulation tissue forming. No drainage noted. No obvious signs of surrounding infection. Integumentary (Hair, Skin) Wound #1 status is Open. Original cause of wound was Gradually Appeared. The date acquired was: 09/04/2021. The wound is located on the Left,Anterior Forearm. The wound measures 1cm length x 1.3cm width x 0.2cm depth; 1.021cm^2 area and 0.204cm^3 volume. There is Fat Layer (Subcutaneous Tissue) exposed. There is no tunneling or undermining noted. There is a medium amount of serosanguineous drainage noted. There is small (1-33%) red granulation within the wound bed. There is a large (67-100%) amount of necrotic tissue within the wound bed including Eschar and Adherent Slough. ZAYVEN, POWE ([id number]) Assessment Active Problems ICD-10 Unspecified open wound of left upper arm, initial encounter Type 2 diabetes mellitus without complications Patient presents with a 1 month history of nonhealing wound to his left forearm. He has nonviable tissue present. This is tightly adhered to the wound bed and would benefit greatly from debridement. This is currently impeding wound healing. Patient has bright health and I was told that he would need prior auth for an office debridement. For now I recommended Santyl to the wound bed daily. We gave patient a coupon to use at the pharmacy. Follow-up in 1 week. We also had a long discussion about the importance of glycemic control in wound healing. I advised he follow-up with his primary care physician to discuss an insulin regiment. He expressed understanding. Procedures Wound #1 Pre-procedure diagnosis of Wound #1 is an Atypical located on the Left,Anterior Forearm . There was a Chemical/Enzymatic/Mechanical debridement performed by Geralyn Corwin, MD. With the following instrument(s): santyl after achieving pain control using Lidocaine 4% Topical Solution. Agent used was [employer]. A time out was conducted at 09:23, prior to the start of the procedure. There was no bleeding. The procedure was tolerated well with a pain level of 0 throughout and a pain level of 0 following the procedure. Post Debridement Measurements: 1cm length x 1.3cm width x 0.2cm depth; 0.204cm^3 volume. Character of Wound/Ulcer Post Debridement is improved. Post procedure Diagnosis Wound #1: Same as Pre-Procedure Plan Follow-up Appointments: Return Appointment in 1 week. Bathing/ Shower/ Hygiene: May shower; gently cleanse wound with antibacterial soap, rinse and pat dry prior to dressing wounds WOUND #1: - Forearm Wound Laterality: Left, Anterior Primary Dressing: Hydrofera Blue Ready Transfer Foam, 2.5x2.5 (in/in) (DME) (Generic) Every Other Day/30  Days Discharge Instructions: Apply Hydrofera Blue Ready to wound bed as directed Primary Dressing: Santyl Collagenase Ointment, 30 (gm), tube Every Other Day/30 Days Secondary Dressing: Zetuvit Plus Silicone Border Dressing 4x4 (in/in) (DME) (Generic) Every Other Day/30 Days 1. Santyl 2. Prior Auth for all in office debridement 3. Follow-up in 1 week Electronic Signature(s) Signed: 10/02/2021 10:44:51 AM By: Geralyn Corwin DO Entered By: Geralyn Corwin on 10/02/2021 10:44:04 [place], Valma Cava ([id number]) -------------------------------------------------------------------------------- ROS/PFSH Details Patient Name: [name] Date of Service: 10/02/2021 8:45 AM Medical Record Number: [id number] Patient Account Number: [id number] Date of Birth/Sex: 1996-08-12 (25 y.o. M) Treating RN: Yevonne Pax Primary Care Provider: Karsten Fells Other Clinician: Referring Provider: Patterson Hammersmith Treating Provider/Extender: Tilda Franco in Treatment: 0 Information Obtained From Patient Integumentary (Skin) Complaints and Symptoms: Positive for: Wounds Endocrine Medical History: Positive for: Type II Diabetes Treated with: Insulin Blood sugar tested every day: No Immunizations Pneumococcal Vaccine: Received Pneumococcal Vaccination: No Implantable Devices None Family and Social History Alcohol Use: Rarely; Drug Use: No History; Caffeine Use: Rarely; Financial  Concerns: No; Food, Clothing or Shelter Needs: No; Support System Lacking: No; Transportation Concerns: No Electronic Signature(s) Signed: 10/02/2021 10:44:51 AM By: Geralyn Corwin DO Signed: 10/03/2021 1:01:44 PM By: Yevonne Pax RN Entered By: Yevonne Pax on 10/02/2021 09:01:05 [place], Valma Cava ([id number]) -------------------------------------------------------------------------------- SuperBill Details Patient Name: [name] Date of Service: 10/02/2021 Medical Record Number: [id number] Patient  Account Number: [id number] Date of Birth/Sex: 01-13-1996 (25 y.o. M) Treating RN: Yevonne Pax Primary Care Provider: Karsten Fells Other Clinician: Referring Provider: Patterson Hammersmith Treating Provider/Extender: Tilda Franco in Treatment: 0 Diagnosis Coding ICD-10 Codes Code Description S41.102A Unspecified open wound of left upper arm, initial encounter E11.9 Type 2 diabetes mellitus without complications Facility Procedures CPT4 Code: 88110315 Description: 99213 - WOUND CARE VISIT-LEV 3 EST PT Modifier: Quantity: 1 CPT4 Code: 94585929 Description: 24462 - DEBRIDE W/O ANES NON SELECT Modifier: Quantity: 1 Physician Procedures CPT4 Code: 8638177 Description: WC PHYS LEVEL 3 o NEW PT Modifier: Quantity: 1 CPT4 Code: Description: ICD-10 Diagnosis Description S41.102A Unspecified open wound of left upper arm, initial encounter E11.9 Type 2 diabetes mellitus without complications Modifier: Quantity: Electronic Signature(s) Signed: 10/02/2021 10:44:51 AM By: Geralyn Corwin DO Entered By: Geralyn Corwin on 10/02/2021 10:44:17

## 2021-10-03 NOTE — Progress Notes (Signed)
ERLE, GUSTER ([id number]) Visit Report for 10/02/2021 Abuse/Suicide Risk Screen Details Patient Name: [name], [name]. Date of Service: 10/02/2021 8:45 AM Medical Record Number: [id number] Patient Account Number: [id number] Date of Birth/Sex: 1996-05-07 (25 y.o. M) Treating RN: Yevonne Pax Primary Care Ashly Goethe: Karsten Fells Other Clinician: Referring Keyshawna Prouse: Patterson Hammersmith Treating Makeila Yamaguchi/Extender: Tilda Franco in Treatment: 0 Abuse/Suicide Risk Screen Items Answer ABUSE RISK SCREEN: Has anyone close to you tried to hurt or harm you recentlyo No Do you feel uncomfortable with anyone in your familyo No Has anyone forced you do things that you didnot want to doo No Electronic Signature(s) Signed: 10/03/2021 1:01:44 PM By: Yevonne Pax RN Entered By: Yevonne Pax on 10/02/2021 09:01:24 [place], Valma Cava ([id number]) -------------------------------------------------------------------------------- Activities of Daily Living Details Patient Name: [name] Date of Service: 10/02/2021 8:45 AM Medical Record Number: [id number] Patient Account Number: [id number] Date of Birth/Sex: 01/14/1996 (25 y.o. M) Treating RN: Yevonne Pax Primary Care Alyson Ki: Karsten Fells Other Clinician: Referring Roza Creamer: Patterson Hammersmith Treating Clarisse Rodriges/Extender: Tilda Franco in Treatment: 0 Activities of Daily Living Items Answer Activities of Daily Living (Please select one for each item) Drive Automobile Completely Able Take Medications Completely Able Use Telephone Completely Able Care for Appearance Completely Able Use Toilet Completely Able Bath / Shower Completely Able Dress Self Completely Able Feed Self Completely Able Walk Completely Able Get In / Out Bed Completely Able Housework Completely Able Prepare Meals Completely Able Handle Money Completely Able Shop for Self Completely Able Electronic Signature(s) Signed: 10/03/2021 1:01:44 PM By: Yevonne Pax RN Entered By: Yevonne Pax on 10/02/2021 09:01:54 [place], Valma Cava ([id number]) -------------------------------------------------------------------------------- Education Screening Details Patient Name: [name] Date of Service: 10/02/2021 8:45 AM Medical Record Number: [id number] Patient Account Number: [id number] Date of Birth/Sex: 07-Aug-1996 (25 y.o. M) Treating RN: Yevonne Pax Primary Care Tyannah Sane: Karsten Fells Other Clinician: Referring Dontrae Morini: Patterson Hammersmith Treating Roselynn Whitacre/Extender: Tilda Franco in Treatment: 0 Learning Preferences/Education Level/Primary Language Learning Preference: Explanation Highest Education Level: College or Above Preferred Language: English Cognitive Barrier Language Barrier: No Translator Needed: No Memory Deficit: No Emotional Barrier: No Cultural/Religious Beliefs Affecting Medical Care: No Physical Barrier Impaired Vision: No Impaired Hearing: No Decreased Hand dexterity: No Knowledge/Comprehension Knowledge Level: Medium Comprehension Level: High Ability to understand written instructions: High Ability to understand verbal instructions: High Motivation Anxiety Level: Anxious Cooperation: Cooperative Education Importance: Acknowledges Need Interest in Health Problems: Asks Questions Perception: Coherent Willingness to Engage in Self-Management High Activities: Readiness to Engage in Self-Management High Activities: Electronic Signature(s) Signed: 10/03/2021 1:01:44 PM By: Yevonne Pax RN Entered By: Yevonne Pax on 10/02/2021 09:02:22 [name] ([id number]) -------------------------------------------------------------------------------- Fall Risk Assessment Details Patient Name: [name] Date of Service: 10/02/2021 8:45 AM Medical Record Number: [id number] Patient Account Number: [id number] Date of Birth/Sex: September 13, 1996 (25 y.o. M) Treating RN: Yevonne Pax Primary Care Aleiya Rye:  Karsten Fells Other Clinician: Referring Bleu Minerd: Patterson Hammersmith Treating Ethelda Deangelo/Extender: Tilda Franco in Treatment: 0 Fall Risk Assessment Items Have you had 2 or more falls in the last 12 monthso 0 No Have you had any fall that resulted in injury in the last 12 monthso 0 No FALLS RISK SCREEN History of falling - immediate or within 3 months 0 No Secondary diagnosis (Do you have 2 or more medical diagnoseso) 0 No Ambulatory aid None/bed rest/wheelchair/nurse 0 No Crutches/cane/walker 0 No Furniture 0 No Intravenous therapy Access/Saline/Heparin Lock 0 No Gait/Transferring Normal/ bed rest/ wheelchair 0 No Weak (short steps with or without shuffle, stooped  but able to lift head while walking, may 0 No seek support from furniture) Impaired (short steps with shuffle, may have difficulty arising from chair, head down, impaired 0 No balance) Mental Status Oriented to own ability 0 No Electronic Signature(s) Signed: 10/03/2021 1:01:44 PM By: Yevonne Pax RN Entered By: Yevonne Pax on 10/02/2021 09:02:36 [place], Valma Cava ([id number]) -------------------------------------------------------------------------------- Foot Assessment Details Patient Name: [name] Date of Service: 10/02/2021 8:45 AM Medical Record Number: [id number] Patient Account Number: [id number] Date of Birth/Sex: February 03, 1996 (25 y.o. M) Treating RN: Yevonne Pax Primary Care Sahra Converse: Karsten Fells Other Clinician: Referring Kenly Henckel: Patterson Hammersmith Treating Saveah Bahar/Extender: Tilda Franco in Treatment: 0 Foot Assessment Items Site Locations + = Sensation present, - = Sensation absent, C = Callus, U = Ulcer R = Redness, W = Warmth, M = Maceration, PU = Pre-ulcerative lesion F = Fissure, S = Swelling, D = Dryness Assessment Right: Left: Other Deformity: No No Prior Foot Ulcer: No No Prior Amputation: No No Charcot Joint: No No Ambulatory Status: Ambulatory Without  Help Gait: Steady Electronic Signature(s) Signed: 10/03/2021 1:01:44 PM By: Yevonne Pax RN Entered By: Yevonne Pax on 10/02/2021 09:03:07 [place], Valma Cava ([id number]) -------------------------------------------------------------------------------- Nutrition Risk Screening Details Patient Name: [name] Date of Service: 10/02/2021 8:45 AM Medical Record Number: [id number] Patient Account Number: [id number] Date of Birth/Sex: Jan 04, 1996 (25 y.o. M) Treating RN: Yevonne Pax Primary Care Svetlana Bagby: Karsten Fells Other Clinician: Referring Zaylia Riolo: Patterson Hammersmith Treating Earl Zellmer/Extender: Tilda Franco in Treatment: 0 Height (in): 70 Weight (lbs): 230 Body Mass Index (BMI): 33 Nutrition Risk Screening Items Score Screening NUTRITION RISK SCREEN: I have an illness or condition that made me change the kind and/or amount of food I eat 0 No I eat fewer than two meals per day 0 No I eat few fruits and vegetables, or milk products 0 No I have three or more drinks of beer, liquor or wine almost every day 0 No I have tooth or mouth problems that make it hard for me to eat 0 No I don't always have enough money to buy the food I need 0 No I eat alone most of the time 0 No I take three or more different prescribed or over-the-counter drugs a day 1 Yes Without wanting to, I have lost or gained 10 pounds in the last six months 0 No I am not always physically able to shop, cook and/or feed myself 0 No Nutrition Protocols Good Risk Protocol 0 No interventions needed Moderate Risk Protocol High Risk Proctocol Risk Level: Good Risk Score: 1 Electronic Signature(s) Signed: 10/03/2021 1:01:44 PM By: Yevonne Pax RN Entered By: Yevonne Pax on 10/02/2021 09:02:55

## 2021-10-03 NOTE — Progress Notes (Signed)
Greg, Manning ([id number]) Visit Report for 10/02/2021 Allergy List Details Patient Name: [name], [name]. Date of Service: 10/02/2021 8:45 AM Medical Record Number: [id number] Patient Account Number: [id number] Date of Birth/Sex: [birth year] (25 y.o. M) Treating RN: Yevonne Pax Primary Care Jahni Nazar: Karsten Fells Other Clinician: Referring Quint Chestnut: Patterson Hammersmith Treating Peytyn Trine/Extender: Tilda Franco in Treatment: 0 Allergies Active Allergies No Known Allergies Allergy Notes Electronic Signature(s) Signed: 10/03/2021 1:01:44 PM By: Yevonne Pax RN Entered By: Yevonne Pax on 10/02/2021 08:58:22 Ferrick, Valma Cava ([id number]) -------------------------------------------------------------------------------- Arrival Information Details Patient Name: [name] Date of Service: 10/02/2021 8:45 AM Medical Record Number: [id number] Patient Account Number: [id number] Date of Birth/Sex: [birth year] (25 y.o. M) Treating RN: Yevonne Pax Primary Care Saki Legore: Karsten Fells Other Clinician: Referring Jayne Peckenpaugh: Patterson Hammersmith Treating Emilianna Barlowe/Extender: Tilda Franco in Treatment: 0 Visit Information Patient Arrived: Ambulatory Arrival Time: 08:56 Accompanied By: self Transfer Assistance: None Patient Identification Verified: Yes Secondary Verification Process Completed: Yes Patient Requires Transmission-Based Precautions: No Patient Has Alerts: No Electronic Signature(s) Signed: 10/03/2021 1:01:44 PM By: Yevonne Pax RN Entered By: Yevonne Pax on 10/02/2021 08:56:38 Schlink, Valma Cava ([id number]) -------------------------------------------------------------------------------- Clinic Level of Care Assessment Details Patient Name: [name] Date of Service: 10/02/2021 8:45 AM Medical Record Number: [id number] Patient Account Number: [id number] Date of Birth/Sex: 1996-07-02 (25 y.o. M) Treating RN: Yevonne Pax Primary Care Curtis Uriarte:  Karsten Fells Other Clinician: Referring Krystine Pabst: Patterson Hammersmith Treating [facility] Cortez/Extender: Tilda Franco in Treatment: 0 Clinic Level of Care Assessment Items TOOL 1 Quantity Score X - Use when EandM and Procedure is performed on INITIAL visit 1 0 ASSESSMENTS - Nursing Assessment / Reassessment X - General Physical Exam (combine w/ comprehensive assessment (listed just below) when performed on new 1 20 pt. evals) X- 1 25 Comprehensive Assessment (HX, ROS, Risk Assessments, Wounds Hx, etc.) ASSESSMENTS - Wound and Skin Assessment / Reassessment []  - Dermatologic / Skin Assessment (not related to wound area) 0 ASSESSMENTS - Ostomy and/or Continence Assessment and Care []  - Incontinence Assessment and Management 0 []  - 0 Ostomy Care Assessment and Management (repouching, etc.) PROCESS - Coordination of Care X - Simple Patient / Family Education for ongoing care 1 15 []  - 0 Complex (extensive) Patient / Family Education for ongoing care []  - 0 Staff obtains , Records, Test Results / Process Orders []  - 0 Staff telephones HHA, Nursing Homes / Clarify orders / etc []  - 0 Routine Transfer to another Facility (non-emergent condition) []  - 0 Routine Hospital Admission (non-emergent condition) X- 1 15 New Admissions / / Ordering NPWT, Apligraf, etc. []  - 0 Emergency Hospital Admission (emergent condition) PROCESS - Special Needs []  - Pediatric / Minor Patient Management 0 []  - 0 Isolation Patient Management []  - 0 Hearing / Language / Visual special needs []  - 0 Assessment of Community assistance (transportation, D/C planning, etc.) []  - 0 Additional assistance / Altered mentation []  - 0 Support Surface(s) Assessment (bed, cushion, seat, etc.) INTERVENTIONS - Miscellaneous []  - External ear exam 0 []  - 0 Patient Transfer (multiple staff / / Similar devices) []  - 0 Simple Staple / Suture removal (25 or less) []  -  0 Complex Staple / Suture removal (26 or more) []  - 0 Hypo/Hyperglycemic Management (do not check if billed separately) X- 1 15 Ankle / Brachial Index (ABI) - do not check if billed separately Has the patient been seen at the hospital within the last three years: Yes Total Score: 90 Level Of Care:  New/Established - Level 3 BRIGHAM, COBBINS ([id number]) Electronic Signature(s) Signed: 10/03/2021 1:01:44 PM By: Yevonne Pax RN Entered By: Yevonne Pax on 10/02/2021 96:75:91 [name] ([id number]) -------------------------------------------------------------------------------- Encounter Discharge Information Details Patient Name: [name] Date of Service: 10/02/2021 8:45 AM Medical Record Number: [id number] Patient Account Number: [id number] Date of Birth/Sex: 12-Sep-1996 (25 y.o. M) Treating RN: Yevonne Pax Primary Care Dennie Vecchio: Karsten Fells Other Clinician: Referring Tajuana Kniskern: Patterson Hammersmith Treating Rafel Garde/Extender: Tilda Franco in Treatment: 0 Encounter Discharge Information Items Post Procedure Vitals Discharge Condition: Stable Temperature (F): 98.9 Ambulatory Status: Ambulatory Pulse (bpm): 99 Discharge Destination: Home Respiratory Rate (breaths/min): 18 Transportation: Private Auto Blood Pressure (mmHg): 149/90 Accompanied By: self Schedule Follow-up Appointment: Yes Clinical Summary of Care: Patient Declined Electronic Signature(s) Signed: 10/02/2021 9:49:47 AM By: Yevonne Pax RN Entered By: Yevonne Pax on 10/02/2021 09:49:47 Roher, Valma Cava ([id number]) -------------------------------------------------------------------------------- Lower Extremity Assessment Details Patient Name: [name] Date of Service: 10/02/2021 8:45 AM Medical Record Number: [id number] Patient Account Number: [id number] Date of Birth/Sex: [birth year] (25 y.o. M) Treating RN: Yevonne Pax Primary Care Georganna Maxson: Karsten Fells Other  Clinician: Referring Thai Hemrick: Patterson Hammersmith Treating Kayde Warehime/Extender: Tilda Franco in Treatment: 0 Electronic Signature(s) Signed: 10/03/2021 1:01:44 PM By: Yevonne Pax RN Entered By: Yevonne Pax on 10/02/2021 08:58:09 Petruzzi, Valma Cava ([id number]) -------------------------------------------------------------------------------- Multi Wound Chart Details Patient Name: [name] Date of Service: 10/02/2021 8:45 AM Medical Record Number: [id number] Patient Account Number: [id number] Date of Birth/Sex: 1996/01/06 (25 y.o. M) Treating RN: Yevonne Pax Primary Care Noya Santarelli: Karsten Fells Other Clinician: Referring Aidric Endicott: Patterson Hammersmith Treating Nikelle Malatesta/Extender: Tilda Franco in Treatment: 0 Vital Signs Height(in): 70 Pulse(bpm): 99 Weight(lbs): 230 Blood Pressure(mmHg): 149/90 Body Mass Index(BMI): 33 Temperature(F): 98.9 Respiratory Rate(breaths/min): 18 Photos: [N/A:N/A] Wound Location: Left, Anterior Forearm N/A N/A Wounding Event: Gradually Appeared N/A N/A Primary Etiology: Atypical N/A N/A Comorbid History: Type II Diabetes N/A N/A Date Acquired: 09/04/2021 N/A N/A Weeks of Treatment: 0 N/A N/A Wound Status: Open N/A N/A Measurements L x W x D (cm) 1x1.3x0.2 N/A N/A Area (cm) : 1.021 N/A N/A Volume (cm) : 0.204 N/A N/A Classification: Full Thickness Without Exposed N/A N/A Support Structures Exudate Amount: Medium N/A N/A Exudate Type: Serosanguineous N/A N/A Exudate Color: red, brown N/A N/A Granulation Amount: Small (1-33%) N/A N/A Granulation Quality: Red N/A N/A Necrotic Amount: Large (67-100%) N/A N/A Necrotic Tissue: Eschar, Adherent Slough N/A N/A Exposed Structures: Fat Layer (Subcutaneous Tissue): N/A N/A Yes Fascia: No Tendon: No Muscle: No Joint: No Bone: No Epithelialization: None N/A N/A Debridement: Chemical/Enzymatic/Mechanical N/A N/A Pre-procedure Verification/Time 09:23 N/A N/A Out Taken: Pain  Control: Lidocaine 4% Topical Solution N/A N/A Instrument: Other(santyl) N/A N/A Bleeding: None N/A N/A Procedural Pain: 0 N/A N/A Post Procedural Pain: 0 N/A N/A Debridement Treatment Procedure was tolerated well N/A N/A Response: Post Debridement 1x1.3x0.2 N/A N/A Measurements L x W x D (cm) Post Debridement Volume: 0.204 N/A N/A (cm) Procedures Performed: Debridement N/A N/A TREVER, STREATER ([id number]) Treatment Notes Wound #1 (Forearm) Wound Laterality: Left, Anterior Cleanser Peri-Wound Care Topical Primary Dressing Hydrofera Blue Ready Transfer Foam, 2.5x2.5 (in/in) Discharge Instruction: Apply Hydrofera Blue Ready to wound bed as directed Santyl Collagenase Ointment, 30 (gm), tube Secondary Dressing Zetuvit Plus Silicone Border Dressing 4x4 (in/in) Secured With Compression Wrap Compression Stockings Add-Ons Electronic Signature(s) Signed: 10/02/2021 10:44:51 AM By: Geralyn Corwin DO Entered By: Geralyn Corwin on 10/02/2021 10:28:06 [name] ([id number]) -------------------------------------------------------------------------------- Multi-Disciplinary Care Plan Details Patient Name: [name] Date  of Service: 10/02/2021 8:45 AM Medical Record Number: [id number] Patient Account Number: [id number] Date of Birth/Sex: [birth year] (25 y.o. M) Treating RN: Yevonne Pax Primary Care Lizvette Lightsey: Karsten Fells Other Clinician: Referring Shakari Qazi: Patterson Hammersmith Treating Shawneen Deetz/Extender: Tilda Franco in Treatment: 0 Active Inactive Wound/Skin Impairment Nursing Diagnoses: Knowledge deficit related to ulceration/compromised skin integrity Goals: Patient/caregiver will verbalize understanding of skin care regimen Date Initiated: 10/02/2021 Target Resolution Date: 11/02/2021 Goal Status: Active Ulcer/skin breakdown will have a volume reduction of 30% by week 4 Date Initiated: 10/02/2021 Target Resolution Date: 11/02/2021 Goal Status:  Active Ulcer/skin breakdown will have a volume reduction of 50% by week 8 Date Initiated: 10/02/2021 Target Resolution Date: 12/02/2021 Goal Status: Active Ulcer/skin breakdown will have a volume reduction of 80% by week 12 Date Initiated: 10/02/2021 Target Resolution Date: 01/02/2022 Goal Status: Active Ulcer/skin breakdown will heal within 14 weeks Date Initiated: 10/02/2021 Target Resolution Date: 02/02/2022 Goal Status: Active Interventions: Assess patient/caregiver ability to obtain necessary supplies Assess patient/caregiver ability to perform ulcer/skin care regimen upon admission and as needed Assess ulceration(s) every visit Notes: Electronic Signature(s) Signed: 10/03/2021 1:01:44 PM By: Yevonne Pax RN Entered By: Yevonne Pax on 10/02/2021 09:22:58 Bellin, Valma Cava ([id number]) -------------------------------------------------------------------------------- Pain Assessment Details Patient Name: [name] Date of Service: 10/02/2021 8:45 AM Medical Record Number: [id number] Patient Account Number: [id number] Date of Birth/Sex: July 25, 1996 (25 y.o. M) Treating RN: Yevonne Pax Primary Care Jj Enyeart: Karsten Fells Other Clinician: Referring Jaquavius Hudler: Patterson Hammersmith Treating Kristiane Morsch/Extender: Tilda Franco in Treatment: 0 Active Problems Location of Pain Severity and Description of Pain Patient Has Paino No Site Locations Pain Management and Medication Current Pain Management: Electronic Signature(s) Signed: 10/03/2021 1:01:44 PM By: Yevonne Pax RN Entered By: Yevonne Pax on 10/02/2021 08:56:46 Trim, Valma Cava ([id number]) -------------------------------------------------------------------------------- Patient/Caregiver Education Details Patient Name: [name] Date of Service: 10/02/2021 8:45 AM Medical Record Number: [id number] Patient Account Number: [id number] Date of Birth/Gender: [birth year] (25 y.o. M) Treating RN: Yevonne Pax Primary Care Physician: Karsten Fells Other Clinician: Referring Physician: Patterson Hammersmith Treating Physician/Extender: Tilda Franco in Treatment: 0 Education Assessment Education Provided To: Patient Education Topics Provided Wound/Skin Impairment: Methods: Explain/Verbal Responses: State content correctly Electronic Signature(s) Signed: 10/03/2021 1:01:44 PM By: Yevonne Pax RN Entered By: Yevonne Pax on 10/02/2021 09:28:59 Vonada, Valma Cava ([id number]) -------------------------------------------------------------------------------- Wound Assessment Details Patient Name: [name] Date of Service: 10/02/2021 8:45 AM Medical Record Number: [id number] Patient Account Number: [id number] Date of Birth/Sex: 10/19/1996 (25 y.o. M) Treating RN: Yevonne Pax Primary Care Avon Mergenthaler: Karsten Fells Other Clinician: Referring Voncille Simm: Patterson Hammersmith Treating Shala Baumbach/Extender: Tilda Franco in Treatment: 0 Wound Status Wound Number: 1 Primary Etiology: Atypical Wound Location: Left, Anterior Forearm Wound Status: Open Wounding Event: Gradually Appeared Comorbid History: Type II Diabetes Date Acquired: 09/04/2021 Weeks Of Treatment: 0 Clustered Wound: No Photos Wound Measurements Length: (cm) 1 Width: (cm) 1.3 Depth: (cm) 0.2 Area: (cm) 1.021 Volume: (cm) 0.204 % Reduction in Area: % Reduction in Volume: Epithelialization: None Tunneling: No Undermining: No Wound Description Classification: Full Thickness Without Exposed Support Structu Exudate Amount: Medium Exudate Type: Serosanguineous Exudate Color: red, brown res Foul Odor After Cleansing: No Slough/Fibrino Yes Wound Bed Granulation Amount: Small (1-33%) Exposed Structure Granulation Quality: Red Fascia Exposed: No Necrotic Amount: Large (67-100%) Fat Layer (Subcutaneous Tissue) Exposed: Yes Necrotic Quality: Eschar, Adherent Slough Tendon Exposed: No Muscle Exposed:  No Joint Exposed: No Bone Exposed: No Treatment Notes Wound #1 (Forearm) Wound Laterality: Left, Anterior Cleanser Peri-Wound Care Topical Primary Dressing Binegar,  Valma Cava ([id number]) Hydrofera Blue Ready Transfer Foam, 2.5x2.5 (in/in) Discharge Instruction: Apply Hydrofera Blue Ready to wound bed as directed Santyl Collagenase Ointment, 30 (gm), tube Secondary Dressing Zetuvit Plus Silicone Border Dressing 4x4 (in/in) Secured With Compression Wrap Compression Stockings Add-Ons Electronic Signature(s) Signed: 10/03/2021 1:01:44 PM By: Yevonne Pax RN Entered By: Yevonne Pax on 10/02/2021 09:12:00 Narayan, Valma Cava ([id number]) -------------------------------------------------------------------------------- Vitals Details Patient Name: [name] Date of Service: 10/02/2021 8:45 AM Medical Record Number: [id number] Patient Account Number: [id number] Date of Birth/Sex: Nov 30, 1996 (25 y.o. M) Treating RN: Yevonne Pax Primary Care Elexus Barman: Karsten Fells Other Clinician: Referring Jalene Lacko: Patterson Hammersmith Treating Chesky Heyer/Extender: Tilda Franco in Treatment: 0 Vital Signs Time Taken: 08:56 Temperature (F): 98.9 Height (in): 70 Pulse (bpm): 99 Source: Stated Respiratory Rate (breaths/min): 18 Weight (lbs): 230 Blood Pressure (mmHg): 149/90 Source: Measured Reference Range: 80 - 120 mg / dl Body Mass Index (BMI): 33 Electronic Signature(s) Signed: 10/03/2021 1:01:44 PM By: Yevonne Pax RN Entered By: Yevonne Pax on 10/02/2021 08:57:41

## 2021-10-09 ENCOUNTER — Encounter (HOSPITAL_BASED_OUTPATIENT_CLINIC_OR_DEPARTMENT_OTHER): Payer: 59 | Admitting: Internal Medicine

## 2021-10-09 ENCOUNTER — Other Ambulatory Visit: Payer: Self-pay

## 2021-10-09 DIAGNOSIS — E119 Type 2 diabetes mellitus without complications: Secondary | ICD-10-CM | POA: Diagnosis not present

## 2021-10-09 DIAGNOSIS — S41102D Unspecified open wound of left upper arm, subsequent encounter: Secondary | ICD-10-CM

## 2021-10-09 NOTE — Progress Notes (Signed)
[name] ([id number]) Visit Report for 10/09/2021 Chief Complaint Document Details Patient Name: [name], [name]. Date of Service: 10/09/2021 1:15 PM Medical Record Number: [id number] Patient Account Number: [id number] Date of Birth/Sex: 1996/11/22 (25 y.o. M) Treating RN: Rogers Blocker Primary Care Provider: Karsten Fells Other Clinician: Referring Provider: Karsten Fells Treating Provider/Extender: Tilda Franco in Treatment: 1 Information Obtained from: Patient Chief Complaint Left arm wound Electronic Signature(s) Signed: 10/09/2021 1:44:35 PM By: Geralyn Corwin DO Entered By: Geralyn Corwin on 10/09/2021 13:40:37 Tabares, Valma Cava ([id number]) -------------------------------------------------------------------------------- HPI Details Patient Name: [name] Date of Service: 10/09/2021 1:15 PM Medical Record Number: [id number] Patient Account Number: [id number] Date of Birth/Sex: September 23, 1996 (25 y.o. M) Treating RN: Rogers Blocker Primary Care Provider: Karsten Fells Other Clinician: Referring Provider: Karsten Fells Treating Provider/Extender: Tilda Franco in Treatment: 1 History of Present Illness HPI Description: Admission 10/02/2021 Mr. Taray Normoyle is a 25 year old male with a past medical history of uncontrolled insulin-dependent type 2 diabetes that presents to the clinic for a left arm wound. He states this started out as a raised lesion that he scratched and became infected. He was evaluated by urgent care on 09/13/2021 and recommended using mupirocin cream and was placed on doxycycline. He subsequently followed up 3 days later for wound check. He was told to place a dry dressing on this and follow-up with wound care. He reports stability in wound healing. He denies current signs of infection. He states that his blood glucose levels are high with a hemoglobin A1c of 14. He currently feels well. 10/19; patient presents for 1  week follow-up. He was unable to obtain Santyl but has been using an ointment on the wound bed daily. When the intake nurse was taking the dressing off the nonviable tissue came off with it. He now has a clean granulation tissue bed. He currently denies pain and denies any signs of infection. Electronic Signature(s) Signed: 10/09/2021 1:44:35 PM By: Geralyn Corwin DO Entered By: Geralyn Corwin on 10/09/2021 13:42:07 Brannum, Valma Cava ([id number]) -------------------------------------------------------------------------------- Physical Exam Details Patient Name: [name] Date of Service: 10/09/2021 1:15 PM Medical Record Number: [id number] Patient Account Number: [id number] Date of Birth/Sex: 02-Oct-1996 (25 y.o. M) Treating RN: Rogers Blocker Primary Care Provider: Karsten Fells Other Clinician: Referring Provider: Karsten Fells Treating Provider/Extender: Tilda Franco in Treatment: 1 Constitutional . Psychiatric . Notes Left arm: To the forearm there is a circular open wound with excellent granulation tissue present. No induration noted. No drainage noted. No signs of infection. Electronic Signature(s) Signed: 10/09/2021 1:44:35 PM By: Geralyn Corwin DO Entered By: Geralyn Corwin on 10/09/2021 13:42:55 Cerrone, Valma Cava ([id number]) -------------------------------------------------------------------------------- Physician Orders Details Patient Name: [name] Date of Service: 10/09/2021 1:15 PM Medical Record Number: [id number] Patient Account Number: [id number] Date of Birth/Sex: 1996/10/16 (25 y.o. M) Treating RN: Rogers Blocker Primary Care Provider: Karsten Fells Other Clinician: Referring Provider: Karsten Fells Treating Provider/Extender: Tilda Franco in Treatment: 1 Verbal / Phone Orders: No Diagnosis Coding Follow-up Appointments o Return Appointment in 1 week. Bathing/ Shower/ Hygiene o May shower; gently  cleanse wound with antibacterial soap, rinse and pat dry prior to dressing wounds Wound Treatment Wound #1 - Forearm Wound Laterality: Left, Anterior Cleanser: Wound Cleanser 1 x Per Day/30 Days Discharge Instructions: Wash your hands with soap and water. Remove old dressing, discard into plastic bag and place into trash. Cleanse the wound with Wound Cleanser prior to applying a clean dressing using gauze sponges, not tissues or cotton balls.  Do not scrub or use excessive force. Pat dry using gauze sponges, not tissue or cotton balls. Primary Dressing: Prisma 4.34 (in) 1 x Per Day/30 Days Discharge Instructions: Moisten w/normal saline or sterile water; Cover wound as directed. Do not remove from wound bed. Secondary Dressing: Telfa Adhesive Island Dressing, 4x4 (in/in) 1 x Per Day/30 Days Discharge Instructions: Apply over dressing to secure in place. May use regular bandaid to keep collagen in place Electronic Signature(s) Signed: 10/09/2021 1:44:35 PM By: Geralyn Corwin DO Signed: 10/09/2021 4:16:56 PM By: Rogers Blocker RN Entered By: Rogers Blocker on 10/09/2021 13:40:57 Burleigh, Valma Cava ([id number]) -------------------------------------------------------------------------------- Problem List Details Patient Name: [name] Date of Service: 10/09/2021 1:15 PM Medical Record Number: [id number] Patient Account Number: [id number] Date of Birth/Sex: 1996-03-14 (25 y.o. M) Treating RN: Rogers Blocker Primary Care Provider: Karsten Fells Other Clinician: Referring Provider: Karsten Fells Treating Provider/Extender: Tilda Franco in Treatment: 1 Active Problems ICD-10 Encounter Code Description Active Date MDM Diagnosis S41.102D Unspecified open wound of left upper arm, subsequent encounter 10/02/2021 No Yes E11.9 Type 2 diabetes mellitus without complications 10/02/2021 No Yes Inactive Problems Resolved Problems Electronic Signature(s) Signed: 10/09/2021  1:44:35 PM By: Geralyn Corwin DO Entered By: Geralyn Corwin on 10/09/2021 13:40:23 Bailly, Valma Cava ([id number]) -------------------------------------------------------------------------------- Progress Note Details Patient Name: [name] Date of Service: 10/09/2021 1:15 PM Medical Record Number: [id number] Patient Account Number: [id number] Date of Birth/Sex: 1996-09-08 (25 y.o. M) Treating RN: Rogers Blocker Primary Care Provider: Karsten Fells Other Clinician: Referring Provider: Karsten Fells Treating Provider/Extender: Tilda Franco in Treatment: 1 Subjective Chief Complaint Information obtained from Patient Left arm wound History of Present Illness (HPI) Admission 10/02/2021 Mr. Revan Gendron is a 25 year old male with a past medical history of uncontrolled insulin-dependent type 2 diabetes that presents to the clinic for a left arm wound. He states this started out as a raised lesion that he scratched and became infected. He was evaluated by urgent care on 09/13/2021 and recommended using mupirocin cream and was placed on doxycycline. He subsequently followed up 3 days later for wound check. He was told to place a dry dressing on this and follow-up with wound care. He reports stability in wound healing. He denies current signs of infection. He states that his blood glucose levels are high with a hemoglobin A1c of 14. He currently feels well. 10/19; patient presents for 1 week follow-up. He was unable to obtain Santyl but has been using an ointment on the wound bed daily. When the intake nurse was taking the dressing off the nonviable tissue came off with it. He now has a clean granulation tissue bed. He currently denies pain and denies any signs of infection. Patient History Information obtained from Patient. Social History Alcohol Use - Rarely, Drug Use - No History, Caffeine Use - Rarely. Medical History Endocrine Patient has history of Type II  Diabetes Objective Constitutional Vitals Time Taken: 1:31 PM, Height: 70 in, Weight: 230 lbs, BMI: 33, Temperature: 98.3 F, Pulse: 120 bpm, Respiratory Rate: 18 breaths/min, Blood Pressure: 115/81 mmHg. General Notes: Left arm: To the forearm there is a circular open wound with excellent granulation tissue present. No induration noted. No drainage noted. No signs of infection. Integumentary (Hair, Skin) Wound #1 status is Open. Original cause of wound was Gradually Appeared. The date acquired was: 09/04/2021. The wound has been in treatment 1 weeks. The wound is located on the Left,Anterior Forearm. The wound measures 0.5cm length x 1cm width x 0.2cm depth; 0.393cm^2 area  and 0.079cm^3 volume. There is Fat Layer (Subcutaneous Tissue) exposed. There is no tunneling or undermining noted. There is a medium amount of sanguinous drainage noted. There is large (67-100%) red granulation within the wound bed. There is no necrotic tissue within the wound bed. Assessment Active Problems NICOLAUS, ANDEL ([id number]) ICD-10 Unspecified open wound of left upper arm, subsequent encounter Type 2 diabetes mellitus without complications Patient's wound shows improvement in appearance since last clinic visit. No signs of infection. I recommended using collagen on this daily. Follow-up in 1 week. Plan Follow-up Appointments: Return Appointment in 1 week. Bathing/ Shower/ Hygiene: May shower; gently cleanse wound with antibacterial soap, rinse and pat dry prior to dressing wounds WOUND #1: - Forearm Wound Laterality: Left, Anterior Cleanser: Wound Cleanser 1 x Per Day/30 Days Discharge Instructions: Wash your hands with soap and water. Remove old dressing, discard into plastic bag and place into trash. Cleanse the wound with Wound Cleanser prior to applying a clean dressing using gauze sponges, not tissues or cotton balls. Do not scrub or use excessive force. Pat dry using gauze sponges, not tissue or  cotton balls. Primary Dressing: Prisma 4.34 (in) 1 x Per Day/30 Days Discharge Instructions: Moisten w/normal saline or sterile water; Cover wound as directed. Do not remove from wound bed. Secondary Dressing: Telfa Adhesive Island Dressing, 4x4 (in/in) 1 x Per Day/30 Days Discharge Instructions: Apply over dressing to secure in place. May use regular bandaid to keep collagen in place 1. Collagen daily 2. Follow-up in 1 week Electronic Signature(s) Signed: 10/09/2021 1:44:35 PM By: Geralyn Corwin DO Entered By: Geralyn Corwin on 10/09/2021 13:44:04 Weisel, Valma Cava ([id number]) -------------------------------------------------------------------------------- ROS/PFSH Details Patient Name: [name] Date of Service: 10/09/2021 1:15 PM Medical Record Number: [id number] Patient Account Number: [id number] Date of Birth/Sex: 1996/02/21 (25 y.o. M) Treating RN: Rogers Blocker Primary Care Provider: Karsten Fells Other Clinician: Referring Provider: Karsten Fells Treating Provider/Extender: Tilda Franco in Treatment: 1 Information Obtained From Patient Endocrine Medical History: Positive for: Type II Diabetes Treated with: Insulin Blood sugar tested every day: No Immunizations Pneumococcal Vaccine: Received Pneumococcal Vaccination: No Implantable Devices None Family and Social History Alcohol Use: Rarely; Drug Use: No History; Caffeine Use: Rarely; Financial Concerns: No; Food, Clothing or Shelter Needs: No; Support System Lacking: No; Transportation Concerns: No Electronic Signature(s) Signed: 10/09/2021 1:44:35 PM By: Geralyn Corwin DO Signed: 10/09/2021 4:16:56 PM By: Rogers Blocker RN Entered By: Geralyn Corwin on 10/09/2021 13:42:14 Klasen, Valma Cava ([id number]) -------------------------------------------------------------------------------- SuperBill Details Patient Name: [name] Date of Service: 10/09/2021 Medical Record Number:  [id number] Patient Account Number: [id number] Date of Birth/Sex: [birth year] (25 y.o. M) Treating RN: Rogers Blocker Primary Care Provider: Karsten Fells Other Clinician: Referring Provider: Karsten Fells Treating Provider/Extender: Tilda Franco in Treatment: 1 Diagnosis Coding ICD-10 Codes Code Description S41.102D Unspecified open wound of left upper arm, subsequent encounter E11.9 Type 2 diabetes mellitus without complications Facility Procedures CPT4 Code: 35009381 Description: 99213 - WOUND CARE VISIT-LEV 3 EST PT Modifier: Quantity: 1 Physician Procedures CPT4 Code: 8299371 Description: 99213 - WC PHYS LEVEL 3 - EST PT Modifier: Quantity: 1 CPT4 Code: Description: ICD-10 Diagnosis Description S41.102D Unspecified open wound of left upper arm, subsequent encounter E11.9 Type 2 diabetes mellitus without complications Modifier: Quantity: Electronic Signature(s) Signed: 10/09/2021 1:48:19 PM By: Rogers Blocker RN Signed: 10/09/2021 2:50:01 PM By: Geralyn Corwin DO Previous Signature: 10/09/2021 1:44:35 PM Version By: Geralyn Corwin DO Entered By: Rogers Blocker on 10/09/2021 13:48:18

## 2021-10-09 NOTE — Progress Notes (Signed)
AYDRIAN, HALPIN ([id number]) Visit Report for 10/09/2021 Arrival Information Details Patient Name: [name], [name]. Date of Service: 10/09/2021 1:15 PM Medical Record Number: [id number] Patient Account Number: [id number] Date of Birth/Sex: [birth year] (25 y.o. M) Treating RN: Dolan Amen Primary Care Jesalyn Finazzo: Harvel Ricks Other Clinician: Referring Itali Mckendry: Harvel Ricks Treating Shivangi Lutz/Extender: Yaakov Guthrie in Treatment: 1 Visit Information History Since Last Visit Pain Present Now: [name] Patient Arrived: Ambulatory Arrival Time: 13:31 Accompanied By: son Transfer Assistance: None Patient Identification Verified: Yes Secondary Verification Process Completed: Yes Patient Requires Transmission-Based Precautions: [name] Patient Has Alerts: [name] Electronic Signature(s) Signed: 10/09/2021 4:16:56 PM By: Dolan Amen RN Entered By: Dolan Amen on 10/09/2021 13:31:49 [name], [name] ([id number]) -------------------------------------------------------------------------------- Clinic Level of Care Assessment Details Patient Name: [name] Date of Service: 10/09/2021 1:15 PM Medical Record Number: [id number] Patient Account Number: [id number] Date of Birth/Sex: October 10, 1996 (25 y.o. M) Treating RN: Dolan Amen Primary Care Kenden Brandt: Harvel Ricks Other Clinician: Referring Marcelis Wissner: Harvel Ricks Treating Dayton Sherr/Extender: Yaakov Guthrie in Treatment: 1 Clinic Level of Care Assessment Items TOOL 4 Quantity Score X - Use when only an EandM is performed on FOLLOW-UP visit 1 0 ASSESSMENTS - Nursing Assessment / Reassessment X - Reassessment of Co-morbidities (includes updates in patient status) 1 10 X- 1 5 Reassessment of Adherence to Treatment Plan ASSESSMENTS - Wound and Skin Assessment / Reassessment X - Simple Wound Assessment / Reassessment - one wound 1 5 '[]'  - 0 Complex Wound Assessment / Reassessment - multiple wounds '[]'  -  0 Dermatologic / Skin Assessment (not related to wound area) ASSESSMENTS - Focused Assessment '[]'  - Circumferential Edema Measurements - multi extremities 0 '[]'  - 0 Nutritional Assessment / Counseling / Intervention '[]'  - 0 Lower Extremity Assessment (monofilament, tuning fork, pulses) '[]'  - 0 Peripheral Arterial Disease Assessment (using hand held doppler) ASSESSMENTS - Ostomy and/or Continence Assessment and Care '[]'  - Incontinence Assessment and Management 0 '[]'  - 0 Ostomy Care Assessment and Management (repouching, etc.) PROCESS - Coordination of Care X - Simple Patient / Family Education for ongoing care 1 15 '[]'  - 0 Complex (extensive) Patient / Family Education for ongoing care '[]'  - 0 Staff obtains Programmer, systems, Records, Test Results / Process Orders '[]'  - 0 Staff telephones HHA, Nursing Homes / Clarify orders / etc '[]'  - 0 Routine Transfer to another Facility (non-emergent condition) '[]'  - 0 Routine Hospital Admission (non-emergent condition) '[]'  - 0 New Admissions / Biomedical engineer / Ordering NPWT, Apligraf, etc. '[]'  - 0 Emergency Hospital Admission (emergent condition) X- 1 10 Simple Discharge Coordination '[]'  - 0 Complex (extensive) Discharge Coordination PROCESS - Special Needs '[]'  - Pediatric / Minor Patient Management 0 '[]'  - 0 Isolation Patient Management '[]'  - 0 Hearing / Language / Visual special needs '[]'  - 0 Assessment of Community assistance (transportation, D/C planning, etc.) '[]'  - 0 Additional assistance / Altered mentation '[]'  - 0 Support Surface(s) Assessment (bed, cushion, seat, etc.) INTERVENTIONS - Wound Cleansing / Measurement [name], Greg J. ([id number]) X- 1 5 Simple Wound Cleansing - one wound '[]'  - 0 Complex Wound Cleansing - multiple wounds X- 1 5 Wound Imaging (photographs - any number of wounds) '[]'  - 0 Wound Tracing (instead of photographs) X- 1 5 Simple Wound Measurement - one wound '[]'  - 0 Complex Wound Measurement - multiple  wounds INTERVENTIONS - Wound Dressings '[]'  - Small Wound Dressing one or multiple wounds 0 X- 1 15 Medium Wound Dressing one or multiple wounds '[]'  - 0 Large Wound Dressing one  or multiple wounds '[]'  - 0 Application of Medications - topical '[]'  - 0 Application of Medications - injection INTERVENTIONS - Miscellaneous '[]'  - External ear exam 0 '[]'  - 0 Specimen Collection (cultures, biopsies, blood, body fluids, etc.) '[]'  - 0 Specimen(s) / Culture(s) sent or taken to Lab for analysis '[]'  - 0 Patient Transfer (multiple staff / Civil Service fast streamer / Similar devices) '[]'  - 0 Simple Staple / Suture removal (25 or less) '[]'  - 0 Complex Staple / Suture removal (26 or more) '[]'  - 0 Hypo / Hyperglycemic Management (close monitor of Blood Glucose) '[]'  - 0 Ankle / Brachial Index (ABI) - do not check if billed separately X- 1 5 Vital Signs Has the patient been seen at the hospital within the last three years: Yes Total Score: 80 Level Of Care: New/Established - Level 3 Electronic Signature(s) Signed: 10/09/2021 4:16:56 PM By: Dolan Amen RN Entered By: Dolan Amen on 10/09/2021 13:48:11 [name], [name] ([id number]) -------------------------------------------------------------------------------- Encounter Discharge Information Details Patient Name: [name] Date of Service: 10/09/2021 1:15 PM Medical Record Number: [id number] Patient Account Number: [id number] Date of Birth/Sex: 09-Sep-1996 (25 y.o. M) Treating RN: Dolan Amen Primary Care Quintan Saldivar: Harvel Ricks Other Clinician: Referring Garyson Stelly: Harvel Ricks Treating Ciin Brazzel/Extender: Yaakov Guthrie in Treatment: 1 Encounter Discharge Information Items Discharge Condition: Stable Ambulatory Status: Ambulatory Discharge Destination: Home Transportation: Private Auto Accompanied By: son Schedule Follow-up Appointment: Yes Clinical Summary of Care: Electronic Signature(s) Signed: 10/09/2021 1:48:58 PM By: Dolan Amen RN Entered By: Dolan Amen on 10/09/2021 13:48:58 [name], [name] ([id number]) -------------------------------------------------------------------------------- Lower Extremity Assessment Details Patient Name: [name] Date of Service: 10/09/2021 1:15 PM Medical Record Number: [id number] Patient Account Number: [id number] Date of Birth/Sex: [birth year] (25 y.o. M) Treating RN: Dolan Amen Primary Care Jolaine Fryberger: Harvel Ricks Other Clinician: Referring Bruk Tumolo: Harvel Ricks Treating Krist Rosenboom/Extender: Yaakov Guthrie in Treatment: 1 Electronic Signature(s) Signed: 10/09/2021 4:16:56 PM By: Dolan Amen RN Entered By: Dolan Amen on 10/09/2021 13:38:47 Foiles, [name] ([id number]) -------------------------------------------------------------------------------- Multi Wound Chart Details Patient Name: [name] Date of Service: 10/09/2021 1:15 PM Medical Record Number: [id number] Patient Account Number: [id number] Date of Birth/Sex: 1996-07-31 (25 y.o. M) Treating RN: Dolan Amen Primary Care Henri Guedes: Harvel Ricks Other Clinician: Referring Anant Agard: Harvel Ricks Treating Janai Maudlin/Extender: Yaakov Guthrie in Treatment: 1 Vital Signs Height(in): 70 Pulse(bpm): 120 Weight(lbs): 230 Blood Pressure(mmHg): 115/81 Body Mass Index(BMI): 33 Temperature(F): 98.3 Respiratory Rate(breaths/min): 18 Photos: [N/A:N/A] Wound Location: Left, Anterior Forearm N/A N/A Wounding Event: Gradually Appeared N/A N/A Primary Etiology: Atypical N/A N/A Comorbid History: Type II Diabetes N/A N/A Date Acquired: 09/04/2021 N/A N/A Weeks of Treatment: 1 N/A N/A Wound Status: Open N/A N/A Measurements L x W x D (cm) 0.5x1x0.2 N/A N/A Area (cm) : 0.393 N/A N/A Volume (cm) : 0.079 N/A N/A % Reduction in Area: 61.50% N/A N/A % Reduction in Volume: 61.30% N/A N/A Classification: Full Thickness Without Exposed N/A N/A Support  Structures Exudate Amount: Medium N/A N/A Exudate Type: Sanguinous N/A N/A Exudate Color: red N/A N/A Granulation Amount: Large (67-100%) N/A N/A Granulation Quality: Red N/A N/A Necrotic Amount: None Present (0%) N/A N/A Exposed Structures: Fat Layer (Subcutaneous Tissue): N/A N/A Yes Fascia: [name] Tendon: [name] Muscle: [name] Joint: [name] Bone: [name] Epithelialization: None N/A N/A Treatment Notes Electronic Signature(s) Signed: 10/09/2021 1:44:35 PM By: Kalman Shan DO Entered By: Kalman Shan on 10/09/2021 13:40:30 [name], [name] ([id number]) -------------------------------------------------------------------------------- Multi-Disciplinary Care Plan Details Patient Name: [name] Date of Service: 10/09/2021 1:15 PM  Medical Record Number: [id number] Patient Account Number: [id number] Date of Birth/Sex: Mar 13, 1996 (25 y.o. M) Treating RN: Dolan Amen Primary Care Breann Losano: Harvel Ricks Other Clinician: Referring Ricahrd Schwager: Harvel Ricks Treating Yacob Wilkerson/Extender: Yaakov Guthrie in Treatment: 1 Active Inactive Wound/Skin Impairment Nursing Diagnoses: Knowledge deficit related to ulceration/compromised skin integrity Goals: Patient/caregiver will verbalize understanding of skin care regimen Date Initiated: 10/02/2021 Date Inactivated: 10/09/2021 Target Resolution Date: 11/02/2021 Goal Status: Met Ulcer/skin breakdown will have a volume reduction of 30% by week 4 Date Initiated: 10/02/2021 Target Resolution Date: 11/02/2021 Goal Status: Active Ulcer/skin breakdown will have a volume reduction of 50% by week 8 Date Initiated: 10/02/2021 Target Resolution Date: 12/02/2021 Goal Status: Active Ulcer/skin breakdown will have a volume reduction of 80% by week 12 Date Initiated: 10/02/2021 Target Resolution Date: 01/02/2022 Goal Status: Active Ulcer/skin breakdown will heal within 14 weeks Date Initiated: 10/02/2021 Target Resolution Date:  02/02/2022 Goal Status: Active Interventions: Assess patient/caregiver ability to obtain necessary supplies Assess patient/caregiver ability to perform ulcer/skin care regimen upon admission and as needed Assess ulceration(s) every visit Notes: Electronic Signature(s) Signed: 10/09/2021 4:16:56 PM By: Dolan Amen RN Entered By: Dolan Amen on 10/09/2021 13:38:56 [name], [name] ([id number]) -------------------------------------------------------------------------------- Pain Assessment Details Patient Name: [name] Date of Service: 10/09/2021 1:15 PM Medical Record Number: [id number] Patient Account Number: [id number] Date of Birth/Sex: [birth year] (25 y.o. M) Treating RN: Dolan Amen Primary Care Remigio Mcmillon: Harvel Ricks Other Clinician: Referring Brettney Ficken: Harvel Ricks Treating Fatimata Talsma/Extender: Yaakov Guthrie in Treatment: 1 Active Problems Location of Pain Severity and Description of Pain Patient Has Paino [name] Site Locations Pain Management and Medication Current Pain Management: Electronic Signature(s) Signed: 10/09/2021 4:16:56 PM By: Dolan Amen RN Entered By: Dolan Amen on 10/09/2021 13:33:38 [name], [name] ([id number]) -------------------------------------------------------------------------------- Patient/Caregiver Education Details Patient Name: [name] Date of Service: 10/09/2021 1:15 PM Medical Record Number: [id number] Patient Account Number: [id number] Date of Birth/Gender: 21-Mar-1996 (25 y.o. M) Treating RN: Dolan Amen Primary Care Physician: Harvel Ricks Other Clinician: Referring Physician: Harvel Ricks Treating Physician/Extender: Yaakov Guthrie in Treatment: 1 Education Assessment Education Provided To: Patient Education Topics Provided Wound/Skin Impairment: Methods: Explain/Verbal Responses: State content correctly Electronic Signature(s) Signed: 10/09/2021 4:16:56 PM By: Dolan Amen RN Entered By: Dolan Amen on 10/09/2021 13:48:30 [name], [name] ([id number]) -------------------------------------------------------------------------------- Wound Assessment Details Patient Name: [name] Date of Service: 10/09/2021 1:15 PM Medical Record Number: [id number] Patient Account Number: [id number] Date of Birth/Sex: [birth year] (25 y.o. M) Treating RN: Dolan Amen Primary Care Rola Lennon: Harvel Ricks Other Clinician: Referring Naoma Boxell: Harvel Ricks Treating Rajiv Parlato/Extender: Yaakov Guthrie in Treatment: 1 Wound Status Wound Number: 1 Primary Etiology: Atypical Wound Location: Left, Anterior Forearm Wound Status: Open Wounding Event: Gradually Appeared Comorbid History: Type II Diabetes Date Acquired: 09/04/2021 Weeks Of Treatment: 1 Clustered Wound: [name] Photos Wound Measurements Length: (cm) 0.5 Width: (cm) 1 Depth: (cm) 0.2 Area: (cm) 0.393 Volume: (cm) 0.079 % Reduction in Area: 61.5% % Reduction in Volume: 61.3% Epithelialization: None Tunneling: [name] Undermining: [name] Wound Description Classification: Full Thickness Without Exposed Support Structures Exudate Amount: Medium Exudate Type: Sanguinous Exudate Color: red Foul Odor After Cleansing: [name] Slough/Fibrino Yes Wound Bed Granulation Amount: Large (67-100%) Exposed Structure Granulation Quality: Red Fascia Exposed: [name] Necrotic Amount: None Present (0%) Fat Layer (Subcutaneous Tissue) Exposed: Yes Tendon Exposed: [name] Muscle Exposed: [name] Joint Exposed: [name] Bone Exposed: [name] Treatment Notes Wound #1 (Forearm) Wound Laterality: Left, Anterior Cleanser Wound Cleanser Discharge Instruction: Wash your hands with soap and water. Remove old  dressing, discard into plastic bag and place into trash. Cleanse the wound with Wound Cleanser prior to applying a clean dressing using gauze sponges, not tissues or cotton balls. Do not scrub or use excessive force. Pat dry using  gauze sponges, not tissue or cotton balls. [name], [name] ([id number]) Peri-Wound Care Topical Primary Dressing Prisma 4.34 (in) Discharge Instruction: Moisten w/normal saline or sterile water; Cover wound as directed. Do not remove from wound bed. Secondary Dressing Telfa Adhesive [place] Dressing, 4x4 (in/in) Discharge Instruction: Apply over dressing to secure in place. May use regular bandaid to keep collagen in place Secured With Compression Wrap Compression Stockings Add-Ons Electronic Signature(s) Signed: 10/09/2021 4:16:56 PM By: Dolan Amen RN Entered By: Dolan Amen on 10/09/2021 13:38:35 [name], [name] ([id number]) -------------------------------------------------------------------------------- Vitals Details Patient Name: [name] Date of Service: 10/09/2021 1:15 PM Medical Record Number: [id number] Patient Account Number: [id number] Date of Birth/Sex: 02/09/1996 (25 y.o. M) Treating RN: Dolan Amen Primary Care Jeannia Tatro: Harvel Ricks Other Clinician: Referring Tatiyanna Lashley: Harvel Ricks Treating Briceida Rasberry/Extender: Yaakov Guthrie in Treatment: 1 Vital Signs Time Taken: 13:31 Temperature (F): 98.3 Height (in): 70 Pulse (bpm): 120 Weight (lbs): 230 Respiratory Rate (breaths/min): 18 Body Mass Index (BMI): 33 Blood Pressure (mmHg): 115/81 Reference Range: 80 - 120 mg / dl Electronic Signature(s) Signed: 10/09/2021 4:16:56 PM By: Dolan Amen RN Entered By: Dolan Amen on 10/09/2021 13:33:20

## 2021-10-16 ENCOUNTER — Other Ambulatory Visit: Payer: Self-pay

## 2021-10-16 ENCOUNTER — Encounter (HOSPITAL_BASED_OUTPATIENT_CLINIC_OR_DEPARTMENT_OTHER): Payer: 59 | Admitting: Internal Medicine

## 2021-10-16 DIAGNOSIS — E119 Type 2 diabetes mellitus without complications: Secondary | ICD-10-CM

## 2021-10-16 DIAGNOSIS — S41102D Unspecified open wound of left upper arm, subsequent encounter: Secondary | ICD-10-CM

## 2021-10-17 NOTE — Progress Notes (Signed)
JAFAR, POFFENBERGER ([id number]) Visit Report for 10/16/2021 Chief Complaint Document Details Patient Name: [name], [name]. Date of Service: 10/16/2021 3:15 PM Medical Record Number: [id number] Patient Account Number: [id number] Date of Birth/Sex: 09-21-1996 (25 y.o. M) Treating RN: Hansel Feinstein Primary Care Provider: Karsten Fells Other Clinician: Referring Provider: Karsten Fells Treating Provider/Extender: Tilda Franco in Treatment: 2 Information Obtained from: Patient Chief Complaint Left arm wound Electronic Signature(s) Signed: 10/16/2021 4:46:07 PM By: Geralyn Corwin DO Entered By: Geralyn Corwin on 10/16/2021 16:42:37 Kuhnert, Valma Cava ([id number]) -------------------------------------------------------------------------------- HPI Details Patient Name: [name] Date of Service: 10/16/2021 3:15 PM Medical Record Number: [id number] Patient Account Number: [id number] Date of Birth/Sex: 10-Sep-1996 (25 y.o. M) Treating RN: Hansel Feinstein Primary Care Provider: Karsten Fells Other Clinician: Referring Provider: Karsten Fells Treating Provider/Extender: Tilda Franco in Treatment: 2 History of Present Illness HPI Description: Admission 10/02/2021 Mr. Larnell Granlund is a 25 year old male with a past medical history of uncontrolled insulin-dependent type 2 diabetes that presents to the clinic for a left arm wound. He states this started out as a raised lesion that he scratched and became infected. He was evaluated by urgent care on 09/13/2021 and recommended using mupirocin cream and was placed on doxycycline. He subsequently followed up 3 days later for wound check. He was told to place a dry dressing on this and follow-up with wound care. He reports stability in wound healing. He denies current signs of infection. He states that his blood glucose levels are high with a hemoglobin A1c of 14. He currently feels well. 10/19; patient presents for 1 week  follow-up. He was unable to obtain Santyl but has been using an ointment on the wound bed daily. When the intake nurse was taking the dressing off the nonviable tissue came off with it. He now has a clean granulation tissue bed. He currently denies pain and denies any signs of infection. 10/26; patient presents for 1 week follow-up. He has been using collagen to the wound site with improvement to wound healing. He denies signs of infection. Electronic Signature(s) Signed: 10/16/2021 4:46:07 PM By: Geralyn Corwin DO Entered By: Geralyn Corwin on 10/16/2021 16:43:54 Signor, Valma Cava ([id number]) -------------------------------------------------------------------------------- Physical Exam Details Patient Name: [name] Date of Service: 10/16/2021 3:15 PM Medical Record Number: [id number] Patient Account Number: [id number] Date of Birth/Sex: 1996/04/18 (25 y.o. M) Treating RN: Hansel Feinstein Primary Care Provider: Karsten Fells Other Clinician: Referring Provider: Karsten Fells Treating Provider/Extender: Tilda Franco in Treatment: 2 Constitutional . Psychiatric . Notes Left arm: To the forearm there is a small open wound with healthy granulation tissue present. No induration noted. No drainage or signs of infection. Electronic Signature(s) Signed: 10/16/2021 4:46:07 PM By: Geralyn Corwin DO Entered By: Geralyn Corwin on 10/16/2021 16:44:25 Marrin, Valma Cava ([id number]) -------------------------------------------------------------------------------- Physician Orders Details Patient Name: [name] Date of Service: 10/16/2021 3:15 PM Medical Record Number: [id number] Patient Account Number: [id number] Date of Birth/Sex: [birth year] (25 y.o. M) Treating RN: Hansel Feinstein Primary Care Provider: Karsten Fells Other Clinician: Referring Provider: Karsten Fells Treating Provider/Extender: Tilda Franco in Treatment: 2 Verbal / Phone Orders:  No Diagnosis Coding Follow-up Appointments o Return Appointment in 1 week. Bathing/ Shower/ Hygiene o May shower; gently cleanse wound with antibacterial soap, rinse and pat dry prior to dressing wounds Additional Orders / Instructions o Follow Nutritious Diet and Increase Protein Intake o Other: - Monitor blood sugar Wound Treatment Wound #1 - Forearm Wound Laterality: Left, Anterior Cleanser: Wound Cleanser 1 x  Per Day/30 Days Discharge Instructions: Wash your hands with soap and water. Remove old dressing, discard into plastic bag and place into trash. Cleanse the wound with Wound Cleanser prior to applying a clean dressing using gauze sponges, not tissues or cotton balls. Do not scrub or use excessive force. Pat dry using gauze sponges, not tissue or cotton balls. Primary Dressing: Prisma 4.34 (in) 1 x Per Day/30 Days Discharge Instructions: Moisten w/normal saline or sterile water; Cover wound as directed. Do not remove from wound bed. Secondary Dressing: Telfa Adhesive Island Dressing, 4x4 (in/in) 1 x Per Day/30 Days Discharge Instructions: Apply over dressing to secure in place. May use regular bandaid to keep collagen in place Electronic Signature(s) Signed: 10/16/2021 4:08:36 PM By: Hansel Feinstein Signed: 10/16/2021 4:46:07 PM By: Geralyn Corwin DO Entered By: Hansel Feinstein on 10/16/2021 15:57:07 Porada, Valma Cava ([id number]) -------------------------------------------------------------------------------- Problem List Details Patient Name: [name] Date of Service: 10/16/2021 3:15 PM Medical Record Number: [id number] Patient Account Number: [id number] Date of Birth/Sex: 07/24/1996 (25 y.o. M) Treating RN: Hansel Feinstein Primary Care Provider: Karsten Fells Other Clinician: Referring Provider: Karsten Fells Treating Provider/Extender: Tilda Franco in Treatment: 2 Active Problems ICD-10 Encounter Code Description Active Date MDM Diagnosis S41.102D  Unspecified open wound of left upper arm, subsequent encounter 10/02/2021 No Yes E11.9 Type 2 diabetes mellitus without complications 10/02/2021 No Yes Inactive Problems Resolved Problems Electronic Signature(s) Signed: 10/16/2021 4:46:07 PM By: Geralyn Corwin DO Entered By: Geralyn Corwin on 10/16/2021 16:42:09 Geisler, Valma Cava ([id number]) -------------------------------------------------------------------------------- Progress Note Details Patient Name: [name] Date of Service: 10/16/2021 3:15 PM Medical Record Number: [id number] Patient Account Number: [id number] Date of Birth/Sex: 26-Aug-1996 (25 y.o. M) Treating RN: Hansel Feinstein Primary Care Provider: Karsten Fells Other Clinician: Referring Provider: Karsten Fells Treating Provider/Extender: Tilda Franco in Treatment: 2 Subjective Chief Complaint Information obtained from Patient Left arm wound History of Present Illness (HPI) Admission 10/02/2021 Mr. Amit Leece is a 25 year old male with a past medical history of uncontrolled insulin-dependent type 2 diabetes that presents to the clinic for a left arm wound. He states this started out as a raised lesion that he scratched and became infected. He was evaluated by urgent care on 09/13/2021 and recommended using mupirocin cream and was placed on doxycycline. He subsequently followed up 3 days later for wound check. He was told to place a dry dressing on this and follow-up with wound care. He reports stability in wound healing. He denies current signs of infection. He states that his blood glucose levels are high with a hemoglobin A1c of 14. He currently feels well. 10/19; patient presents for 1 week follow-up. He was unable to obtain Santyl but has been using an ointment on the wound bed daily. When the intake nurse was taking the dressing off the nonviable tissue came off with it. He now has a clean granulation tissue bed. He currently denies pain and  denies any signs of infection. 10/26; patient presents for 1 week follow-up. He has been using collagen to the wound site with improvement to wound healing. He denies signs of infection. Patient History Information obtained from Patient. Social History Alcohol Use - Rarely, Drug Use - No History, Caffeine Use - Rarely. Medical History Endocrine Patient has history of Type II Diabetes Objective Constitutional Vitals Time Taken: 3:46 PM, Height: 70 in, Weight: 230 lbs, BMI: 33, Temperature: 99.1 F, Pulse: 102 bpm, Respiratory Rate: 16 breaths/min, Blood Pressure: 131/84 mmHg. General Notes: Left arm: To the forearm there is  a small open wound with healthy granulation tissue present. No induration noted. No drainage or signs of infection. Integumentary (Hair, Skin) Wound #1 status is Open. Original cause of wound was Gradually Appeared. The date acquired was: 09/04/2021. The wound has been in treatment 2 weeks. The wound is located on the Left,Anterior Forearm. The wound measures 0.2cm length x 0.1cm width x 0.1cm depth; 0.016cm^2 area and 0.002cm^3 volume. There is Fat Layer (Subcutaneous Tissue) exposed. There is no tunneling or undermining noted. There is a medium amount of sanguinous drainage noted. There is large (67-100%) red granulation within the wound bed. There is no necrotic tissue within the wound bed. SYLVAIN, HASTEN ([id number]) Assessment Active Problems ICD-10 Unspecified open wound of left upper arm, subsequent encounter Type 2 diabetes mellitus without complications Patient's wound has shown improvement in size and appearance since last clinic visit. He has done very well with collagen. I recommended continuing this until the area is epithelialized. Follow-up in 1 week. Plan Follow-up Appointments: Return Appointment in 1 week. Bathing/ Shower/ Hygiene: May shower; gently cleanse wound with antibacterial soap, rinse and pat dry prior to dressing wounds Additional  Orders / Instructions: Follow Nutritious Diet and Increase Protein Intake Other: - Monitor blood sugar WOUND #1: - Forearm Wound Laterality: Left, Anterior Cleanser: Wound Cleanser 1 x Per Day/30 Days Discharge Instructions: Wash your hands with soap and water. Remove old dressing, discard into plastic bag and place into trash. Cleanse the wound with Wound Cleanser prior to applying a clean dressing using gauze sponges, not tissues or cotton balls. Do not scrub or use excessive force. Pat dry using gauze sponges, not tissue or cotton balls. Primary Dressing: Prisma 4.34 (in) 1 x Per Day/30 Days Discharge Instructions: Moisten w/normal saline or sterile water; Cover wound as directed. Do not remove from wound bed. Secondary Dressing: Telfa Adhesive Island Dressing, 4x4 (in/in) 1 x Per Day/30 Days Discharge Instructions: Apply over dressing to secure in place. May use regular bandaid to keep collagen in place 1. Collagen 2. Follow-up in 1 week Electronic Signature(s) Signed: 10/16/2021 4:46:07 PM By: Geralyn Corwin DO Entered By: Geralyn Corwin on 10/16/2021 16:45:18 Swalley, Valma Cava ([id number]) -------------------------------------------------------------------------------- ROS/PFSH Details Patient Name: [name] Date of Service: 10/16/2021 3:15 PM Medical Record Number: [id number] Patient Account Number: [id number] Date of Birth/Sex: 1996-01-19 (25 y.o. M) Treating RN: Hansel Feinstein Primary Care Provider: Karsten Fells Other Clinician: Referring Provider: Karsten Fells Treating Provider/Extender: Tilda Franco in Treatment: 2 Information Obtained From Patient Endocrine Medical History: Positive for: Type II Diabetes Treated with: Insulin Blood sugar tested every day: No Immunizations Pneumococcal Vaccine: Received Pneumococcal Vaccination: No Implantable Devices None Family and Social History Alcohol Use: Rarely; Drug Use: No History; Caffeine Use:  Rarely; Financial Concerns: No; Food, Clothing or Shelter Needs: No; Support System Lacking: No; Transportation Concerns: No Electronic Signature(s) Signed: 10/16/2021 4:46:07 PM By: Geralyn Corwin DO Signed: 10/17/2021 3:14:11 PM By: Hansel Feinstein Entered By: Geralyn Corwin on 10/16/2021 16:44:00 Yaffe, Valma Cava ([id number]) -------------------------------------------------------------------------------- SuperBill Details Patient Name: [name] Date of Service: 10/16/2021 Medical Record Number: [id number] Patient Account Number: [id number] Date of Birth/Sex: 1996/04/20 (25 y.o. M) Treating RN: Hansel Feinstein Primary Care Provider: Karsten Fells Other Clinician: Referring Provider: Karsten Fells Treating Provider/Extender: Tilda Franco in Treatment: 2 Diagnosis Coding ICD-10 Codes Code Description S41.102D Unspecified open wound of left upper arm, subsequent encounter E11.9 Type 2 diabetes mellitus without complications Facility Procedures CPT4 Code: [id number] Description: 47425 - WOUND CARE VISIT-LEV 2 EST  PT Modifier: Quantity: 1 Physician Procedures CPT4 Code: [id number] Description: 99213 - WC PHYS LEVEL 3 - EST PT Modifier: Quantity: 1 CPT4 Code: Description: ICD-10 Diagnosis Description S41.102D Unspecified open wound of left upper arm, subsequent encounter E11.9 Type 2 diabetes mellitus without complications Modifier: Quantity: Electronic Signature(s) Signed: 10/16/2021 4:46:07 PM By: Geralyn Corwin DO Previous Signature: 10/16/2021 4:08:36 PM Version By: Hansel Feinstein Entered By: Geralyn Corwin on 10/16/2021 16:45:38

## 2021-10-17 NOTE — Progress Notes (Addendum)
[name], [name] ([id number]) Visit Report for 10/16/2021 Arrival Information Details Patient Name: [name], [name]. Date of Service: 10/16/2021 3:15 PM Medical Record Number: [id number] Patient Account Number: [id number] Date of Birth/Sex: 1996-07-20 (25 y.o. M) Treating RN: Hansel Feinstein Primary Care Kuuipo Anzaldo: Karsten Fells Other Clinician: Referring Aishani Kalis: Karsten Fells Treating Ashwin Tibbs/Extender: Tilda Franco in Treatment: 2 Visit Information History Since Last Visit Added or deleted any medications: No Patient Arrived: Ambulatory Had a fall or experienced change in No Arrival Time: 15:43 activities of daily living that may affect Accompanied By: self risk of falls: Transfer Assistance: None Hospitalized since last visit: No Patient Identification Verified: Yes Has Dressing in Place as Prescribed: Yes Secondary Verification Process Completed: Yes Pain Present Now: No Patient Requires Transmission-Based Precautions: No Patient Has Alerts: No Electronic Signature(s) Signed: 10/16/2021 4:08:36 PM By: Hansel Feinstein Entered By: Hansel Feinstein on 10/16/2021 15:44:12 Shoun, Valma Cava ([id number]) -------------------------------------------------------------------------------- Clinic Level of Care Assessment Details Patient Name: [name] Date of Service: 10/16/2021 3:15 PM Medical Record Number: [id number] Patient Account Number: [id number] Date of Birth/Sex: 1996/09/14 (25 y.o. M) Treating RN: Hansel Feinstein Primary Care Bradyn Vassey: Karsten Fells Other Clinician: Referring Zarea Diesing: Karsten Fells Treating Robin Pafford/Extender: Tilda Franco in Treatment: 2 Clinic Level of Care Assessment Items TOOL 4 Quantity Score []  - Use when only an EandM is performed on FOLLOW-UP visit 0 ASSESSMENTS - Nursing Assessment / Reassessment []  - Reassessment of Co-morbidities (includes updates in patient status) 0 []  - 0 Reassessment of Adherence to Treatment  Plan ASSESSMENTS - Wound and Skin Assessment / Reassessment X - Simple Wound Assessment / Reassessment - one wound 1 5 []  - 0 Complex Wound Assessment / Reassessment - multiple wounds []  - 0 Dermatologic / Skin Assessment (not related to wound area) ASSESSMENTS - Focused Assessment []  - Circumferential Edema Measurements - multi extremities 0 []  - 0 Nutritional Assessment / Counseling / Intervention []  - 0 Lower Extremity Assessment (monofilament, tuning fork, pulses) []  - 0 Peripheral Arterial Disease Assessment (using hand held doppler) ASSESSMENTS - Ostomy and/or Continence Assessment and Care []  - Incontinence Assessment and Management 0 []  - 0 Ostomy Care Assessment and Management (repouching, etc.) PROCESS - Coordination of Care X - Simple Patient / Family Education for ongoing care 1 15 []  - 0 Complex (extensive) Patient / Family Education for ongoing care []  - 0 Staff obtains , Records, Test Results / Process Orders []  - 0 Staff telephones HHA, Nursing Homes / Clarify orders / etc []  - 0 Routine Transfer to another Facility (non-emergent condition) []  - 0 Routine Hospital Admission (non-emergent condition) []  - 0 New Admissions / / Ordering NPWT, Apligraf, etc. []  - 0 Emergency Hospital Admission (emergent condition) X- 1 10 Simple Discharge Coordination []  - 0 Complex (extensive) Discharge Coordination PROCESS - Special Needs []  - Pediatric / Minor Patient Management 0 []  - 0 Isolation Patient Management []  - 0 Hearing / Language / Visual special needs []  - 0 Assessment of Community assistance (transportation, D/C planning, etc.) []  - 0 Additional assistance / Altered mentation []  - 0 Support Surface(s) Assessment (bed, cushion, seat, etc.) INTERVENTIONS - Wound Cleansing / Measurement Popescu, Dragan J. ( ) X- 1 5 Simple Wound Cleansing - one wound []  - 0 Complex Wound Cleansing - multiple wounds X- 1 5 Wound  Imaging (photographs - any number of wounds) []  - 0 Wound Tracing (instead of photographs) X- 1 5 Simple Wound Measurement - one wound []  - 0 Complex Wound Measurement -  multiple wounds INTERVENTIONS - Wound Dressings X - Small Wound Dressing one or multiple wounds 1 10 []  - 0 Medium Wound Dressing one or multiple wounds []  - 0 Large Wound Dressing one or multiple wounds X- 1 5 Application of Medications - topical []  - 0 Application of Medications - injection INTERVENTIONS - Miscellaneous []  - External ear exam 0 []  - 0 Specimen Collection (cultures, biopsies, blood, body fluids, etc.) []  - 0 Specimen(s) / Culture(s) sent or taken to Lab for analysis []  - 0 Patient Transfer (multiple staff / / Similar devices) []  - 0 Simple Staple / Suture removal (25 or less) []  - 0 Complex Staple / Suture removal (26 or more) []  - 0 Hypo / Hyperglycemic Management (close monitor of Blood Glucose) []  - 0 Ankle / Brachial Index (ABI) - do not check if billed separately X- 1 5 Vital Signs Has the patient been seen at the hospital within the last three years: Yes Total Score: 65 Level Of Care: New/Established - Level 2 Electronic Signature(s) Signed: 10/16/2021 4:08:36 PM By: Entered By: on 10/16/2021 15:57:33 Mihalic, ( ) -------------------------------------------------------------------------------- Encounter Discharge Information Details Patient Name: Nurse, adult Date of Service: 10/16/2021 3:15 PM Medical Record Number: Patient Account Number: Date of Birth/Sex: [birth year] (25 y.o. M) Treating RN: Hansel Feinstein Primary Care Zanyiah Posten: Hansel Feinstein Other Clinician: Referring Cederick Broadnax: 10/18/2021 Treating Konstance Happel/Extender: Valma Cava in Treatment: 2 Encounter Discharge Information Items Discharge Condition: Stable Ambulatory Status: Ambulatory Discharge Destination:  Home Transportation: Private Auto Accompanied By: self Schedule Follow-up Appointment: Yes Clinical Summary of Care: Electronic Signature(s) Signed: 10/16/2021 4:08:36 PM By: [name] Entered By: 10/18/2021 on 10/16/2021 15:58:27 Moquin, [id number] (08/21/1996) -------------------------------------------------------------------------------- Lower Extremity Assessment Details Patient Name: 10-16-1972 Date of Service: 10/16/2021 3:15 PM Medical Record Number: Karsten Fells Patient Account Number: Karsten Fells Date of Birth/Sex: 19-Apr-1996 (25 y.o. M) Treating RN: Hansel Feinstein Primary Care Royalty Fakhouri: Hansel Feinstein Other Clinician: Referring Kyah Buesing: 10/18/2021 Treating Akiel Fennell/Extender: Valma Cava in Treatment: 2 Electronic Signature(s) Signed: 10/16/2021 4:08:36 PM By: [name] Entered By: 10/18/2021 on 10/16/2021 15:50:26 Carrero, [id number] (08/21/1996) -------------------------------------------------------------------------------- Multi Wound Chart Details Patient Name: 10-16-1972 Date of Service: 10/16/2021 3:15 PM Medical Record Number: Karsten Fells Patient Account Number: Karsten Fells Date of Birth/Sex: 05-Sep-1996 (25 y.o. M) Treating RN: Hansel Feinstein Primary Care Amaiya Scruton: Hansel Feinstein Other Clinician: Referring Aliyyah Riese: 10/18/2021 Treating Piotr Christopher/Extender: Valma Cava in Treatment: 2 Vital Signs Height(in): 70 Pulse(bpm): 102 Weight(lbs): 230 Blood Pressure(mmHg): 131/84 Body Mass Index(BMI): 33 Temperature(F): 99.1 Respiratory Rate(breaths/min): 16 Photos: [N/A:N/A] Wound Location: Left, Anterior Forearm N/A N/A Wounding Event: Gradually Appeared N/A N/A Primary Etiology: Atypical N/A N/A Comorbid History: Type II Diabetes N/A N/A Date Acquired: 09/04/2021 N/A N/A Weeks of Treatment: 2 N/A N/A Wound Status: Open N/A N/A Measurements L x W x D (cm) 0.2x0.1x0.1 N/A N/A Area (cm) : 0.016 N/A N/A Volume (cm) :  0.002 N/A N/A % Reduction in Area: 98.40% N/A N/A % Reduction in Volume: 99.00% N/A N/A Classification: Full Thickness Without Exposed N/A N/A Support Structures Exudate Amount: Medium N/A N/A Exudate Type: Sanguinous N/A N/A Exudate Color: red N/A N/A Granulation Amount: Large (67-100%) N/A N/A Granulation Quality: Red N/A N/A Necrotic Amount: None Present (0%) N/A N/A Exposed Structures: Fat Layer (Subcutaneous Tissue): N/A N/A Yes Fascia: No Tendon: No Muscle: No Joint: No Bone: No Epithelialization: None N/A N/A Treatment Notes Wound #1 (Forearm) Wound Laterality: Left,  Anterior Cleanser Wound Cleanser Discharge Instruction: Wash your hands with soap and water. Remove old dressing, discard into plastic bag and place into trash. Cleanse the wound with Wound Cleanser prior to applying a clean dressing using gauze sponges, not tissues or cotton balls. Do not scrub or use excessive force. Pat dry using gauze sponges, not tissue or cotton balls. Peri-Wound Care DEWARREN, LEDBETTER ([id number]) Topical Primary Dressing Prisma 4.34 (in) Discharge Instruction: Moisten w/normal saline or sterile water; Cover wound as directed. Do not remove from wound bed. Secondary Dressing Telfa Adhesive Island Dressing, 4x4 (in/in) Discharge Instruction: Apply over dressing to secure in place. May use regular bandaid to keep collagen in place Secured With Compression Wrap Compression Stockings Add-Ons Electronic Signature(s) Signed: 10/16/2021 4:46:07 PM By: Geralyn Corwin DO Previous Signature: 10/16/2021 4:08:36 PM Version By: Hansel Feinstein Entered By: Geralyn Corwin on 10/16/2021 16:42:23 Rothbauer, Valma Cava ([id number]) -------------------------------------------------------------------------------- Multi-Disciplinary Care Plan Details Patient Name: [name] Date of Service: 10/16/2021 3:15 PM Medical Record Number: [id number] Patient Account Number: [id number] Date of Birth/Sex:  1996/08/10 (25 y.o. M) Treating RN: Hansel Feinstein Primary Care Sriya Kroeze: Karsten Fells Other Clinician: Referring Alpheus Stiff: Karsten Fells Treating Merrianne Mccumbers/Extender: Tilda Franco in Treatment: 2 Active Inactive Electronic Signature(s) Signed: 11/04/2021 10:54:11 AM By: Elliot Gurney, BSN, RN, CWS, Kim RN, BSN Signed: 11/19/2021 4:43:42 PM By: Hansel Feinstein Previous Signature: 10/16/2021 4:08:36 PM Version By: Hansel Feinstein Entered By: Elliot Gurney BSN, RN, CWS, Kim on 11/04/2021 10:54:11 Carnathan, Valma Cava ([id number]) -------------------------------------------------------------------------------- Pain Assessment Details Patient Name: RUAIRI, STUTSMAN. Date of Service: 10/16/2021 3:15 PM Medical Record Number: [id number] Patient Account Number: [id number] Date of Birth/Sex: 11-Dec-1996 (25 y.o. M) Treating RN: Hansel Feinstein Primary Care Omauri Boeve: Karsten Fells Other Clinician: Referring Petrea Fredenburg: Karsten Fells Treating Mellonie Guess/Extender: Tilda Franco in Treatment: 2 Active Problems Location of Pain Severity and Description of Pain Patient Has Paino No Site Locations Rate the pain. Current Pain Level: 0 Pain Management and Medication Current Pain Management: Electronic Signature(s) Signed: 10/16/2021 4:08:36 PM By: Hansel Feinstein Entered By: Hansel Feinstein on 10/16/2021 15:46:48 Wentzell, Valma Cava ([id number]) -------------------------------------------------------------------------------- Patient/Caregiver Education Details Patient Name: [name] Date of Service: 10/16/2021 3:15 PM Medical Record Number: [id number] Patient Account Number: [id number] Date of Birth/Gender: 15-Apr-1996 (25 y.o. M) Treating RN: Hansel Feinstein Primary Care Physician: Karsten Fells Other Clinician: Referring Physician: Karsten Fells Treating Physician/Extender: Tilda Franco in Treatment: 2 Education Assessment Education Provided To: Patient Education Topics Provided Basic  Hygiene: Nutrition: Wound/Skin Impairment: Electronic Signature(s) Signed: 10/16/2021 4:08:36 PM By: Hansel Feinstein Entered By: Hansel Feinstein on 10/16/2021 15:57:55 Daidone, Valma Cava ([id number]) -------------------------------------------------------------------------------- Wound Assessment Details Patient Name: [name] Date of Service: 10/16/2021 3:15 PM Medical Record Number: [id number] Patient Account Number: [id number] Date of Birth/Sex: 1996-03-02 (25 y.o. M) Treating RN: Hansel Feinstein Primary Care Zanyla Klebba: Karsten Fells Other Clinician: Referring Christiana Gurevich: Karsten Fells Treating Aryka Coonradt/Extender: Tilda Franco in Treatment: 2 Wound Status Wound Number: 1 Primary Etiology: Atypical Wound Location: Left, Anterior Forearm Wound Status: Open Wounding Event: Gradually Appeared Comorbid History: Type II Diabetes Date Acquired: 09/04/2021 Weeks Of Treatment: 2 Clustered Wound: No Photos Wound Measurements Length: (cm) 0.2 Width: (cm) 0.1 Depth: (cm) 0.1 Area: (cm) 0.016 Volume: (cm) 0.002 % Reduction in Area: 98.4% % Reduction in Volume: 99% Epithelialization: None Tunneling: No Undermining: No Wound Description Classification: Full Thickness Without Exposed Support Structu Exudate Amount: Medium Exudate Type: Sanguinous Exudate Color: red res Foul Odor After Cleansing: No Slough/Fibrino No Wound Bed Granulation Amount: Large (67-100%)  Exposed Structure Granulation Quality: Red Fascia Exposed: No Necrotic Amount: None Present (0%) Fat Layer (Subcutaneous Tissue) Exposed: Yes Tendon Exposed: No Muscle Exposed: No Joint Exposed: No Bone Exposed: No Electronic Signature(s) Signed: 10/16/2021 4:08:36 PM By: Hansel Feinstein Entered By: Hansel Feinstein on 10/16/2021 15:50:10 Vonseggern, Valma Cava ([id number]) -------------------------------------------------------------------------------- Vitals Details Patient Name: [name] Date of Service:  10/16/2021 3:15 PM Medical Record Number: [id number] Patient Account Number: [id number] Date of Birth/Sex: [birth year] (25 y.o. M) Treating RN: Hansel Feinstein Primary Care Previn Jian: Karsten Fells Other Clinician: Referring Miraj Truss: Karsten Fells Treating Dejay Kronk/Extender: Tilda Franco in Treatment: 2 Vital Signs Time Taken: 15:46 Temperature (F): 99.1 Height (in): 70 Pulse (bpm): 102 Weight (lbs): 230 Respiratory Rate (breaths/min): 16 Body Mass Index (BMI): 33 Blood Pressure (mmHg): 131/84 Reference Range: 80 - 120 mg / dl Electronic Signature(s) Signed: 10/16/2021 4:08:36 PM By: Hansel Feinstein Entered ByHansel Feinstein on 10/16/2021 15:46:41

## 2021-10-23 ENCOUNTER — Encounter: Payer: 59 | Attending: Internal Medicine | Admitting: Internal Medicine

## 2021-10-23 DIAGNOSIS — S41102D Unspecified open wound of left upper arm, subsequent encounter: Secondary | ICD-10-CM | POA: Insufficient documentation

## 2021-10-23 DIAGNOSIS — Z794 Long term (current) use of insulin: Secondary | ICD-10-CM | POA: Insufficient documentation

## 2021-10-23 DIAGNOSIS — E119 Type 2 diabetes mellitus without complications: Secondary | ICD-10-CM | POA: Insufficient documentation

## 2021-10-30 ENCOUNTER — Encounter: Payer: 59 | Admitting: Internal Medicine

## 2022-05-04 IMAGING — DX DG ANKLE COMPLETE 3+V*R*
3 series · 3 of 3 positions shown · non-contrast
Comparison: None.

CLINICAL DATA: Lateral foot and ankle pain after injury today

EXAM:
RIGHT ANKLE - COMPLETE 3+ VIEW

[ankle ap]
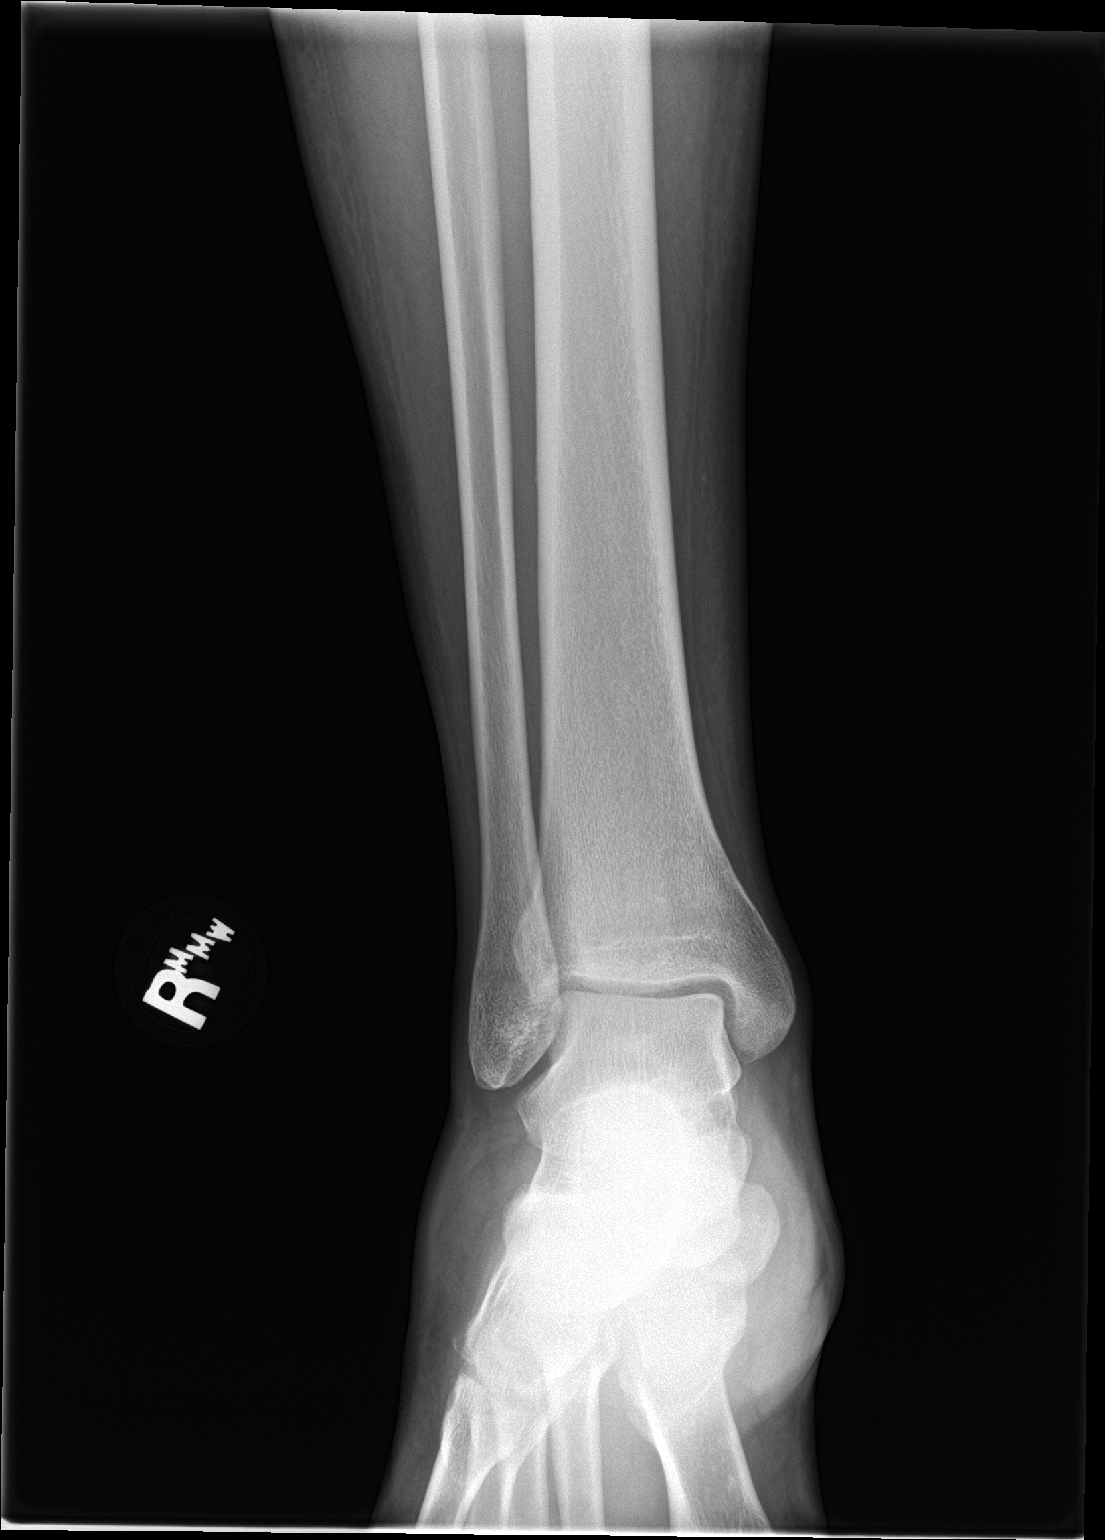

[ankle obl]
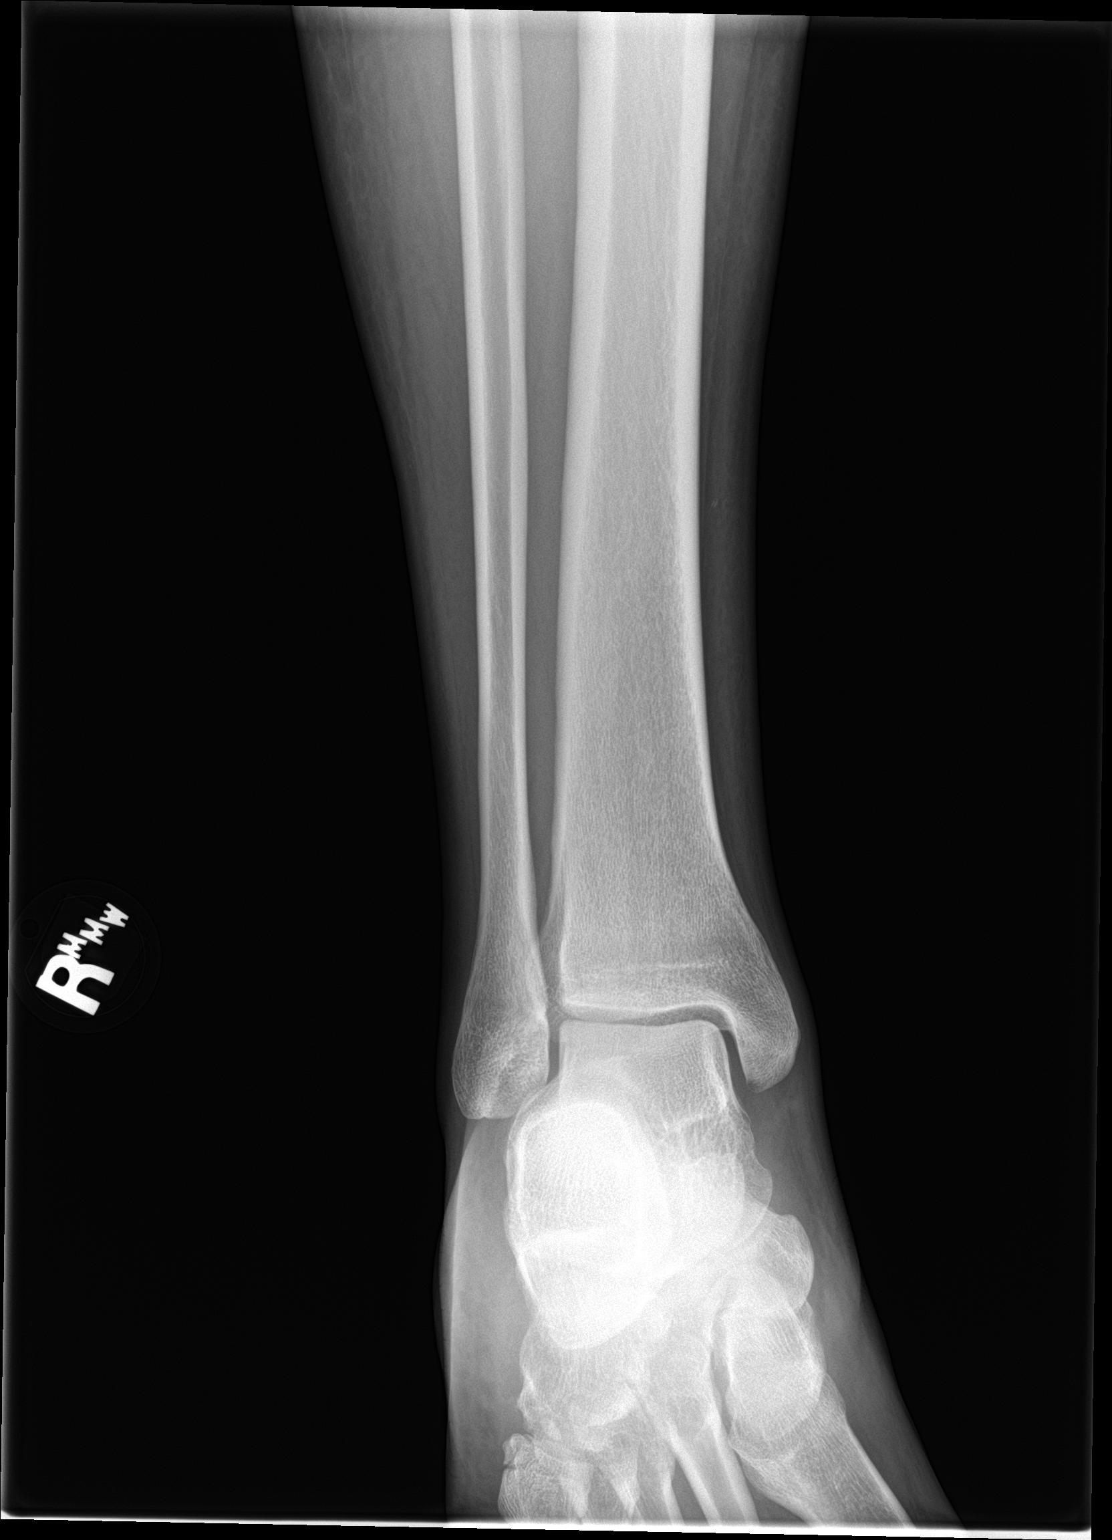

[ankle lat]
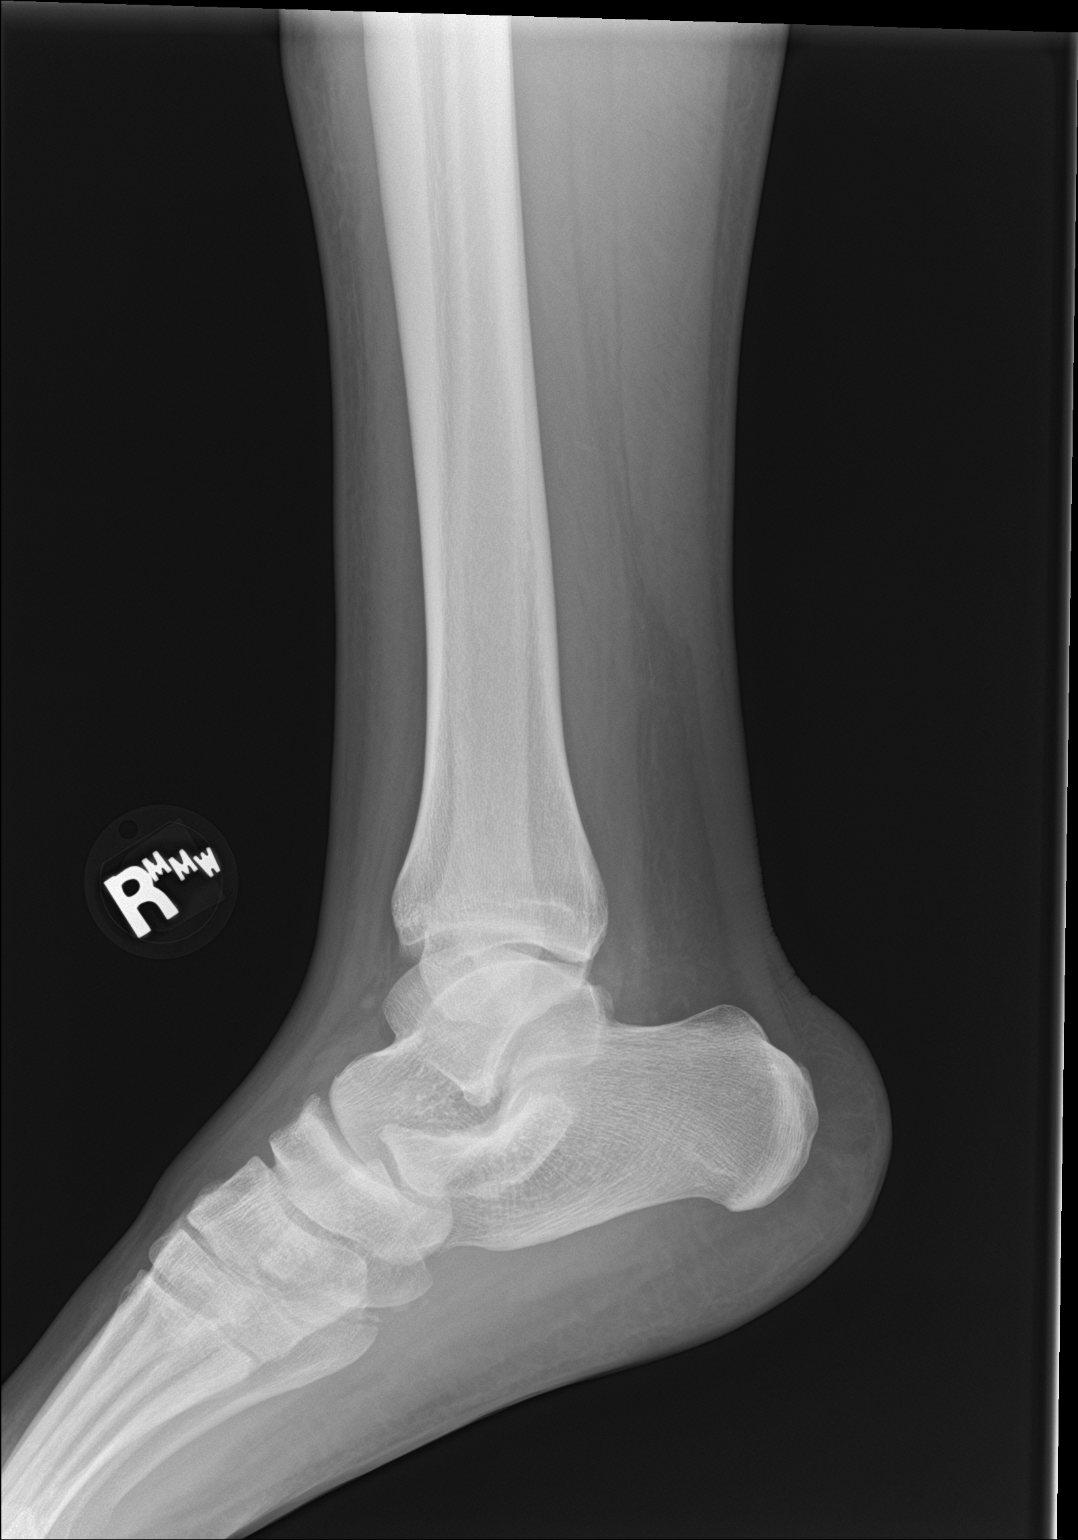

[3 of 3 positions shown; findings below may reference images not displayed]

FINDINGS: There is no evidence of fracture, dislocation, or joint effusion.
There is no evidence of arthropathy or other focal bone abnormality.
Soft tissues are unremarkable.
IMPRESSION: No acute osseous abnormality.

## 2022-07-19 ENCOUNTER — Ambulatory Visit (HOSPITAL_COMMUNITY)
Admission: EM | Admit: 2022-07-19 | Discharge: 2022-07-19 | Disposition: A | Discharge status: Home / Self Care | Payer: PRIVATE HEALTH INSURANCE | Attending: Internal Medicine | Admitting: Internal Medicine

## 2022-07-19 ENCOUNTER — Ambulatory Visit (INDEPENDENT_AMBULATORY_CARE_PROVIDER_SITE_OTHER): Payer: PRIVATE HEALTH INSURANCE

## 2022-07-19 ENCOUNTER — Encounter (HOSPITAL_COMMUNITY): Payer: Self-pay | Admitting: Emergency Medicine

## 2022-07-19 DIAGNOSIS — L089 Local infection of the skin and subcutaneous tissue, unspecified: Secondary | ICD-10-CM | POA: Diagnosis not present

## 2022-07-19 DIAGNOSIS — M79671 Pain in right foot: Secondary | ICD-10-CM | POA: Diagnosis not present

## 2022-07-19 MED ORDER — CEFTRIAXONE SODIUM 1 G IJ SOLR
1.0000 g | Freq: Once | INTRAMUSCULAR | Status: AC
  Administered 2022-07-19: 1 g via INTRAMUSCULAR

## 2022-07-19 MED ORDER — DOXYCYCLINE HYCLATE 100 MG PO CAPS: 100.0000 mg | ORAL_CAPSULE | Freq: Two times a day (BID) | ORAL | 0 refills | Status: AC

## 2022-07-19 MED ORDER — CEFTRIAXONE SODIUM 1 G IJ SOLR
INTRAMUSCULAR | Status: AC
  Filled 2022-07-19: qty 10

## 2022-07-19 MED ORDER — LIDOCAINE HCL (PF) 1 % IJ SOLN
INTRAMUSCULAR | Status: AC
  Filled 2022-07-19: qty 4

## 2022-07-19 NOTE — Discharge Instructions (Signed)
Your foot is infected.  You are being given an antibiotic injection and sent antibiotic pills.  Please start taking antibiotic pills today.  Recommend that you take it with food to prevent nausea.  Please follow-up with podiatry as soon as possible for further evaluation and management.

## 2022-07-19 NOTE — ED Provider Notes (Signed)
[facility]    CSN: [id number] Arrival date & time: 07/19/22  1502      History   Chief Complaint Chief Complaint  Patient presents with   Foot Pain    HPI [name] is a 26 y.o. male.   Patient presents with right foot pain and concern for infection.  Patient reports that he was walking around [place] in dress shoes which caused a blister.  He states that it started draining clear fluid the day after it occurred. Then, it started draining yellow fluid yesterday. He also reports that the swelling seems to be spreading up into ankle. Denies any numbness or tingling. Denies fever, body aches, chills. He does have type 2 diabetes.   Foot Pain    Past Medical History:  Diagnosis Date   Diabetes mellitus without complication (HCC)    High cholesterol     There are no problems to display for this patient.   History reviewed. No pertinent surgical history.     Home Medications    Prior to Admission medications   Medication Sig Start Date End Date Taking? Authorizing Provider  atorvastatin (LIPITOR) 80 MG tablet Take 80 mg by mouth daily.   Yes [provider]  doxycycline (VIBRAMYCIN) 100 MG capsule Take 1 capsule (100 mg total) by mouth 2 (two) times daily. 07/19/22  Yes Valery Amedee, Acie Fredrickson, FNP  glipiZIDE (GLUCOTROL) 10 MG tablet Take 0.5 tablets (5 mg total) by mouth daily before breakfast. 06/23/21  Yes Fayrene Helper, PA-C  insulin glargine (LANTUS) 100 UNIT/ML injection Inject 0.35 mLs (35 Units total) into the skin daily. 06/23/21  Yes Fayrene Helper, PA-C  famotidine (PEPCID) 20 MG tablet Take 1 tablet (20 mg total) by mouth 2 (two) times daily. 01/02/21   Wallis Bamberg, PA-C  mupirocin ointment (BACTROBAN) 2 % Apply 1 application topically 2 (two) times daily. 09/13/21   Raspet, Noberto Retort, PA-C  Omeprazole Magnesium (PRILOSEC PO) Take by mouth.    [provider]  pantoprazole (PROTONIX) 20 MG tablet Take 1 tablet (20 mg total) by mouth  daily. 06/23/21   Fayrene Helper, PA-C  RYBELSUS 3 MG TABS Take 1 tablet by mouth at bedtime. 06/21/20   [provider]    Family History Family History  Problem Relation Age of Onset   Diabetes Mother    Hypertension Mother     Social History Social History   Tobacco Use   Smoking status: Never   Smokeless tobacco: Never  Substance Use Topics   Alcohol use: Yes    Comment: Socially    Drug use: Not Currently     Allergies   Patient has no known allergies.   Review of Systems Review of Systems Per HPI  Physical Exam Triage Vital Signs ED Triage Vitals  Enc Vitals Group     BP 07/19/22 1604 (!) 147/80     Pulse Rate 07/19/22 1604 (!) 102     Resp 07/19/22 1604 18     Temp 07/19/22 1604 99.7 F (37.6 C)     Temp Source 07/19/22 1604 Oral     SpO2 07/19/22 1604 98 %     Weight 07/19/22 1606 235 lb (106.6 kg)     Height 07/19/22 1606 5\' 10"  (1.778 m)     Head Circumference --      Peak Flow --      Pain Score 07/19/22 1606 4     Pain Loc --      Pain  Edu? --      Excl. in GC? --    No data found.  Updated Vital Signs BP (!) 147/80 (BP Location: Right Arm)   Pulse (!) 102   Temp 99.7 F (37.6 C) (Oral)   Resp 18   Ht 5\' 10"  (1.778 m)   Wt 235 lb (106.6 kg)   SpO2 98%   BMI 33.72 kg/m   Visual Acuity Right Eye Distance:   Left Eye Distance:   Bilateral Distance:    Right Eye Near:   Left Eye Near:    Bilateral Near:     Physical Exam Constitutional:      General: He is not in acute distress.    Appearance: Normal appearance. He is not toxic-appearing or diaphoretic.  HENT:     Head: Normocephalic and atraumatic.  Eyes:     Extraocular Movements: Extraocular movements intact.     Conjunctiva/sclera: Conjunctivae normal.  Pulmonary:     Effort: Pulmonary effort is normal.  Feet:     Comments: Approximately 3 inch in diameter open blisterlike area present to plantar surface of foot at ball of foot directly below first great toe.   Blister is not intact.  It does appear to have some mild purulent drainage.  There is surrounding swelling and erythema that extends into dorsal surface of foot and medial ankle.  Patient has full range of motion of toes.  Capillary refill and pulses normal. Neurological:     General: No focal deficit present.     Mental Status: He is alert and oriented to person, place, and time. Mental status is at baseline.  Psychiatric:        Mood and Affect: Mood normal.        Behavior: Behavior normal.        Thought Content: Thought content normal.        Judgment: Judgment normal.      UC Treatments / Results  Labs (all labs ordered are listed, but only abnormal results are displayed) Labs Reviewed - No data to display  EKG   Radiology DG Foot Complete Right  Result Date: 07/19/2022 CLINICAL DATA:  Foot pain with infection. EXAM: RIGHT FOOT COMPLETE - 3+ VIEW COMPARISON:  None Available. FINDINGS: There is no evidence of fracture or dislocation. There is no evidence of arthropathy or other focal bone abnormality. Soft tissues are unremarkable. IMPRESSION: Negative. Electronically Signed   By: 07/21/2022 M.D.   On: 07/19/2022 16:38    Procedures Procedures (including critical care time)  Medications Ordered in UC Medications  cefTRIAXone (ROCEPHIN) injection 1 g (has no administration in time range)    Initial Impression / Assessment and Plan / UC Course  I have reviewed the triage vital signs and the nursing notes.  Pertinent labs & imaging results that were available during my care of the patient were reviewed by me and considered in my medical decision making (see chart for details).     X-ray of foot was completed to ensure that there is not any more extensive infection.  Foot x-ray was negative for any acute abnormality.  Patient does have cellulitis of foot which appears to be from blister.  Low concern for osteomyelitis at this time.  Will treat with IM Rocephin and  doxycycline.  Patient advised on importance of monitoring infection very closely given that he has diabetes.  Provided patient with contact information for podiatry to schedule an appointment for further evaluation and management as soon as possible.  Patient advised that if he is not able to be seen by podiatry then he needs to follow-up with PCP as soon as possible.  Patient may follow-up at urgent care if not able to be seen by either.  Patient was given strict return and ER precautions.  Patient verbalized understanding and was agreeable with plan. Final Clinical Impressions(s) / UC Diagnoses   Final diagnoses:  Right foot infection     Discharge Instructions      Your foot is infected.  You are being given an antibiotic injection and sent antibiotic pills.  Please start taking antibiotic pills today.  Recommend that you take it with food to prevent nausea.  Please follow-up with podiatry as soon as possible for further evaluation and management.     ED Prescriptions     Medication Sig Dispense Auth. Provider   doxycycline (VIBRAMYCIN) 100 MG capsule Take 1 capsule (100 mg total) by mouth 2 (two) times daily. 20 capsule Gustavus Bryant, [place]      PDMP not reviewed this encounter.   Gustavus Bryant, [place] 07/19/22 [phone]

## 2022-07-19 NOTE — ED Triage Notes (Signed)
Patient states that he was in [place] on Wednesday doing a lot of walking around in dress shoes, noticed a lot of drainage from right foot.  The drainage from his foot was clear initially then it turned to a yellowish color.  Now his leg is feeling "tight".  Patient denies any OTC pain meds.

## 2022-08-01 ENCOUNTER — Ambulatory Visit: Payer: PRIVATE HEALTH INSURANCE | Admitting: Podiatry

## 2022-10-01 ENCOUNTER — Ambulatory Visit: Payer: PRIVATE HEALTH INSURANCE | Admitting: Internal Medicine
